# Patient Record
Sex: Female | Born: 1935 | Race: White | Hispanic: No | Marital: Single
Health system: Southern US, Community
[De-identification: ages and names within clinical notes are randomized; demographics above are authoritative.]

---

## 2013-02-01 ENCOUNTER — Inpatient Hospital Stay
Admission: AD | Admit: 2013-02-01 | Discharge: 2013-05-16 | Disposition: E | Payer: Medicare Other | Source: Ambulatory Visit | Attending: Internal Medicine | Admitting: Internal Medicine

## 2013-02-01 DIAGNOSIS — Z930 Tracheostomy status: Secondary | ICD-10-CM

## 2013-02-01 DIAGNOSIS — J9620 Acute and chronic respiratory failure, unspecified whether with hypoxia or hypercapnia: Secondary | ICD-10-CM

## 2013-02-01 DIAGNOSIS — J9601 Acute respiratory failure with hypoxia: Secondary | ICD-10-CM

## 2013-02-01 DIAGNOSIS — B49 Unspecified mycosis: Secondary | ICD-10-CM

## 2013-02-01 DIAGNOSIS — A0472 Enterocolitis due to Clostridium difficile, not specified as recurrent: Secondary | ICD-10-CM

## 2013-02-01 DIAGNOSIS — J189 Pneumonia, unspecified organism: Secondary | ICD-10-CM

## 2013-02-02 ENCOUNTER — Other Ambulatory Visit (HOSPITAL_COMMUNITY): Payer: Medicare Other

## 2013-02-02 LAB — CBC WITH DIFFERENTIAL/PLATELET
Basophils Absolute: 0.1 10*3/uL (ref 0.0–0.1)
Basophils Relative: 0 % (ref 0–1)
Eosinophils Relative: 1 % (ref 0–5)
HCT: 32.2 % — ABNORMAL LOW (ref 36.0–46.0)
Hemoglobin: 11 g/dL — ABNORMAL LOW (ref 12.0–15.0)
Lymphocytes Relative: 5 % — ABNORMAL LOW (ref 12–46)
Lymphs Abs: 1 10*3/uL (ref 0.7–4.0)
MCV: 94.4 fL (ref 78.0–100.0)
Monocytes Absolute: 1.1 10*3/uL — ABNORMAL HIGH (ref 0.1–1.0)
Monocytes Relative: 5 % (ref 3–12)
RDW: 15.4 % (ref 11.5–15.5)
WBC: 22.8 10*3/uL — ABNORMAL HIGH (ref 4.0–10.5)

## 2013-02-02 LAB — COMPREHENSIVE METABOLIC PANEL
Albumin: 3.5 g/dL (ref 3.5–5.2)
Alkaline Phosphatase: 32 U/L — ABNORMAL LOW (ref 39–117)
BUN: 23 mg/dL (ref 6–23)
CO2: 17 mEq/L — ABNORMAL LOW (ref 19–32)
Calcium: 8.5 mg/dL (ref 8.4–10.5)
Chloride: 109 mEq/L (ref 96–112)
Creatinine, Ser: 1.19 mg/dL — ABNORMAL HIGH (ref 0.50–1.10)
GFR calc Af Amer: 50 mL/min — ABNORMAL LOW (ref >90)
GFR calc non Af Amer: 43 mL/min — ABNORMAL LOW (ref >90)
Glucose, Bld: 104 mg/dL — ABNORMAL HIGH (ref 70–99)
Total Bilirubin: 0.6 mg/dL (ref 0.3–1.2)

## 2013-02-02 LAB — BLOOD GAS, ARTERIAL
Acid-base deficit: 5.5 mmol/L — ABNORMAL HIGH (ref 0.0–2.0)
Bicarbonate: 18.5 mEq/L — ABNORMAL LOW (ref 20.0–24.0)
FIO2: 0.35 %
O2 Saturation: 95 %
Patient temperature: 98.6

## 2013-02-02 LAB — PROCALCITONIN: Procalcitonin: 0.45 ng/mL

## 2013-02-02 LAB — TSH: TSH: 13.831 u[IU]/mL — ABNORMAL HIGH (ref 0.350–4.500)

## 2013-02-03 ENCOUNTER — Other Ambulatory Visit (HOSPITAL_COMMUNITY): Payer: Medicare Other

## 2013-02-03 DIAGNOSIS — J9601 Acute respiratory failure with hypoxia: Secondary | ICD-10-CM

## 2013-02-03 DIAGNOSIS — A0472 Enterocolitis due to Clostridium difficile, not specified as recurrent: Secondary | ICD-10-CM

## 2013-02-03 DIAGNOSIS — J189 Pneumonia, unspecified organism: Secondary | ICD-10-CM

## 2013-02-03 LAB — CBC
HCT: 24.4 % — ABNORMAL LOW (ref 36.0–46.0)
MCH: 31.7 pg (ref 26.0–34.0)
MCHC: 33.6 g/dL (ref 30.0–36.0)
MCV: 94.2 fL (ref 78.0–100.0)
Platelets: 291 10*3/uL (ref 150–400)
RBC: 2.59 MIL/uL — ABNORMAL LOW (ref 3.87–5.11)

## 2013-02-03 LAB — BASIC METABOLIC PANEL
CO2: 25 mEq/L (ref 19–32)
Calcium: 8.5 mg/dL (ref 8.4–10.5)
Glucose, Bld: 109 mg/dL — ABNORMAL HIGH (ref 70–99)
Sodium: 143 mEq/L (ref 135–145)

## 2013-02-03 LAB — BLOOD GAS, ARTERIAL
FIO2: 0.9 %
O2 Content: 35 L/min
pCO2 arterial: 45.7 mmHg — ABNORMAL HIGH (ref 35.0–45.0)
pH, Arterial: 7.344 — ABNORMAL LOW (ref 7.350–7.450)
pO2, Arterial: 146 mmHg — ABNORMAL HIGH (ref 80.0–100.0)

## 2013-02-03 NOTE — Consult Note (Signed)
PULMONARY  / CRITICAL CARE MEDICINE  Name: Michelle Cardenas MRN: [id number] DOB: November 10, 1934    ADMISSION DATE:  03/01/13 CONSULTATION DATE:  11/19  REFERRING MD :  11/19 PRIMARY SERVICE:  [facility]   CHIEF COMPLAINT:   Respiratory failure   BRIEF PATIENT DESCRIPTION:   78 year old female w/ h/o CP, dysphagia and MR. Resides at [facility]. Admitted to outside hospital for acute respiratory failure and septic shock  in setting of PCM, aspiration PNA, staph epidermis bacteremia (was likely contaminant). She was  treated w/ broad spec abx, fluid resuscitation, and mechanical ventilation. Extubated at outside hospital. Transferred to [facility] on 11/18 for continued abx and persistent high FIO2 needs. PCCM asked to see on 11/19 for persistent respiratory failure   SIGNIFICANT EVENTS / STUDIES:    LINES / TUBES: Left [place] CVL  CULTURES: Outside facility Blood cultures: staph epidermis Sputum: e coli and proteus C diff positive   ANTIBIOTICS: clinda 11/17>>> Oral vanc 11/17>>> zyvox and cefepime 8 days outside facility Oral vanc X 12 d prior to admit deficid 10 d prior to admit   PAST MEDICAL HISTORY :  Afib, cerebral palsy, MR, dysphagia DM, HTN , resides at SNF Prior to Admission medications   reviewed   Allergies not on file  FAMILY HISTORY:  No family history on file. SOCIAL HISTORY:  has no tobacco, alcohol, and drug history on file.  REVIEW OF SYSTEMS:   Unable   SUBJECTIVE:  Appears comfortable  VITAL SIGNS:   96% on 50% venturi  PHYSICAL EXAMINATION: General:  Chronically ill appearing white female, some mild WOB on venturi mask  Neuro:  Awake, interactive, non-verbal  HEENT:  No JVD Cardiovascular:  Reg irreg  Lungs:  Scattered rhonchi  Abdomen:  Distended, firm to palp + bowel sounds  Musculoskeletal:  Intact Skin:  Diffuse anasarca    Recent Labs Lab 02/02/13 0652 02/03/13 0605  NA 140 143  K 4.1 3.3*  CL 109 109  CO2 17* 25  BUN 23 21  CREATININE 1.19* 1.29*   GLUCOSE 104* 109*    Recent Labs Lab 02/02/13 0652 02/03/13 0605  HGB 11.0* 8.2*  HCT 32.2* 24.4*  WBC 22.8* 17.2*  PLT 346 291   ABG    Component Value Date/Time   PHART 7.393 02/02/2013 0838   PCO2ART 31.0* 02/02/2013 0838   PO2ART 75.7* 02/02/2013 0838   HCO3 18.5* 02/02/2013 0838   TCO2 19.4 02/02/2013 0838   ACIDBASEDEF 5.5* 02/02/2013 0838   O2SAT 95.0 02/02/2013 0838     Dg Chest Port 1 View  02/02/2013   CLINICAL DATA:  Respiratory failure  EXAM: PORTABLE CHEST - 1 VIEW  COMPARISON:  None.  FINDINGS: There is mild cardiomegaly with bilateral effusions and mild bibasilar edema. There is no appreciable airspace consolidation. Central catheter tip is in the right atrium. No pneumothorax. Pulmonary vascularity is within normal limits.  IMPRESSION: Evidence of a degree of congestive heart failure.  No pneumothorax.   Electronically Signed   By: Bretta Bang M.D.   On: 02/02/2013 08:04   Dg Abd Portable 1v  02/02/2013   CLINICAL DATA:  Follow-up ileus  EXAM: PORTABLE ABDOMEN - 1 VIEW  COMPARISON:  Portable exam 0617 hr without priors for comparison  FINDINGS: Gastrostomy tube left upper quadrant.  Nonobstructive bowel gas pattern.  No definite bowel dilatation.  Poorly defined area of calcification in pelvis may represent calcified uterine leiomyoma.  Slightly increased attenuation of the abdomen raising question of ascites.  Bones  appear demineralized with osteoarthritic changes of the left hip and prior right hip replacement.  No definite urinary tract calcification.  IMPRESSION: Nonobstructive bowel gas pattern.  Question ascites.   Electronically Signed   By: Ulyses Southward M.D.   On: 02/02/2013 08:04    ASSESSMENT / PLAN:  Acute respiratory failure. Multifactorial: aspiration pna, prob element of pulmonary edema and also R>L effusion. She appears massively volume overloaded.  rec Cont empiric abx per primary srvc although concerned about clinda in setting of active  Cdiff Would avoid empiric ABX  , clinda as able Cont diuresis although her creatinine may limit this Repeat CXR, might consider thoracentesis: diagnostic/ therapeutic of right effusion She is not a good BIPAP candidate in setting of CP, poor cough and air way management  Need to have goals of care discussion w/ family, probability of this occuring again is very high   SIRS/sepsis In setting of c diff,  aspiration pna, suspect Staph epidermis was contaminant  Plan: abx per primary service  After empiric ABX completed will need 10 days cdiff treatment past this  Acute renal failure  Anasarca  Had mild + AG acidosis prior on 11/18 Plan: Careful w/ diuresis, assess trend in am  F/u creatinine am    Dysphagia  Plan NPO G tube feeds  Anemia  Recent Labs Lab 02/02/13 0652 02/03/13 0605  HGB 11.0* 8.2*  has sig drift in hgb. She does not have clear evidence of bleeding. ? Lab error ? Component of dilution effect rec Have d/w primary service No role for x fusion currently   H/P CP and MR plan Supportive care   Pulmonary and Critical Care Medicine [facility] Pager: [phone]  02/03/2013, 10:22 AM  I have fully examined this patient and agree with above findings.    And edited infull  Mcarthur Rossetti. Tyson Alias, MD, FACP Pgr: [phone] [place] [facility]

## 2013-02-04 DIAGNOSIS — M7989 Other specified soft tissue disorders: Secondary | ICD-10-CM

## 2013-02-04 LAB — BASIC METABOLIC PANEL
CO2: 27 mEq/L (ref 19–32)
Chloride: 109 mEq/L (ref 96–112)
Potassium: 3.3 mEq/L — ABNORMAL LOW (ref 3.5–5.1)
Sodium: 144 mEq/L (ref 135–145)

## 2013-02-04 LAB — CBC
Platelets: 234 10*3/uL (ref 150–400)
RBC: 2.31 MIL/uL — ABNORMAL LOW (ref 3.87–5.11)
WBC: 27 10*3/uL — ABNORMAL HIGH (ref 4.0–10.5)

## 2013-02-04 NOTE — Progress Notes (Signed)
VASCULAR LAB PRELIMINARY  PRELIMINARY  PRELIMINARY  PRELIMINARY  Left upper extremity venous Doppler completed.    Preliminary report:  There is no obvious evidence of DVT or SVT noted in the left upper extremity.  Michelle Cardenas, RVT 02/04/2013, 3:00 PM

## 2013-02-05 ENCOUNTER — Other Ambulatory Visit (HOSPITAL_COMMUNITY): Payer: Medicare Other

## 2013-02-05 DIAGNOSIS — J9600 Acute respiratory failure, unspecified whether with hypoxia or hypercapnia: Secondary | ICD-10-CM

## 2013-02-05 DIAGNOSIS — A0472 Enterocolitis due to Clostridium difficile, not specified as recurrent: Secondary | ICD-10-CM

## 2013-02-05 DIAGNOSIS — J189 Pneumonia, unspecified organism: Secondary | ICD-10-CM

## 2013-02-05 LAB — BLOOD GAS, ARTERIAL
Acid-Base Excess: 3.8 mmol/L — ABNORMAL HIGH (ref 0.0–2.0)
Acid-Base Excess: 2.4 mmol/L — ABNORMAL HIGH (ref 0.0–2.0)
Acid-Base Excess: 3.2 mmol/L — ABNORMAL HIGH (ref 0.0–2.0)
Bicarbonate: 28.3 mEq/L — ABNORMAL HIGH (ref 20.0–24.0)
Bicarbonate: 27.9 mEq/L — ABNORMAL HIGH (ref 20.0–24.0)
Drawn by: 275531
FIO2: 0.7 %
FIO2: 0.8 %
MECHVT: 500 mL
O2 Saturation: 82.8 %
O2 Saturation: 98.3 %
PEEP: 5 cmH2O
Patient temperature: 98.6
Patient temperature: 98.6
TCO2: 29.6 mmol/L (ref 0–100)
pCO2 arterial: 55.4 mmHg — ABNORMAL HIGH (ref 35.0–45.0)
pH, Arterial: 7.395 (ref 7.350–7.450)
pH, Arterial: 7.405 (ref 7.350–7.450)
pO2, Arterial: 130 mmHg — ABNORMAL HIGH (ref 80.0–100.0)
pO2, Arterial: 101 mmHg — ABNORMAL HIGH (ref 80.0–100.0)

## 2013-02-05 LAB — CBC
HCT: 20.9 % — ABNORMAL LOW (ref 36.0–46.0)
Hemoglobin: 7 g/dL — ABNORMAL LOW (ref 12.0–15.0)
MCH: 31.7 pg (ref 26.0–34.0)
MCH: 31.8 pg (ref 26.0–34.0)
MCHC: 33.5 g/dL (ref 30.0–36.0)
MCV: 94.6 fL (ref 78.0–100.0)
MCV: 95 fL (ref 78.0–100.0)
Platelets: 183 10*3/uL (ref 150–400)
Platelets: 190 10*3/uL (ref 150–400)
RBC: 2.21 MIL/uL — ABNORMAL LOW (ref 3.87–5.11)
WBC: 20.2 10*3/uL — ABNORMAL HIGH (ref 4.0–10.5)

## 2013-02-05 LAB — BASIC METABOLIC PANEL
BUN: 20 mg/dL (ref 6–23)
CO2: 24 mEq/L (ref 19–32)
CO2: 28 mEq/L (ref 19–32)
Calcium: 8.3 mg/dL — ABNORMAL LOW (ref 8.4–10.5)
Calcium: 8.5 mg/dL (ref 8.4–10.5)
Chloride: 111 mEq/L (ref 96–112)
Chloride: 108 mEq/L (ref 96–112)
Creatinine, Ser: 1.3 mg/dL — ABNORMAL HIGH (ref 0.50–1.10)
Creatinine, Ser: 1.38 mg/dL — ABNORMAL HIGH (ref 0.50–1.10)
GFR calc Af Amer: 45 mL/min — ABNORMAL LOW (ref >90)
GFR calc non Af Amer: 36 mL/min — ABNORMAL LOW (ref >90)
Glucose, Bld: 154 mg/dL — ABNORMAL HIGH (ref 70–99)
Sodium: 146 mEq/L — ABNORMAL HIGH (ref 135–145)

## 2013-02-05 LAB — HEPATIC FUNCTION PANEL
AST: 22 U/L (ref 0–37)
Albumin: 3.4 g/dL — ABNORMAL LOW (ref 3.5–5.2)
Total Bilirubin: 0.4 mg/dL (ref 0.3–1.2)
Total Protein: 5.2 g/dL — ABNORMAL LOW (ref 6.0–8.3)

## 2013-02-05 LAB — PREPARE RBC (CROSSMATCH)

## 2013-02-05 NOTE — Progress Notes (Signed)
PULMONARY  / CRITICAL CARE MEDICINE  Name: Michelle Cardenas MRN: [id number] DOB: 1934/11/05    ADMISSION DATE:  18-Feb-2013 CONSULTATION DATE:  11/19  REFERRING MD :  11/19 PRIMARY SERVICE:  [facility]   CHIEF COMPLAINT:   Respiratory failure   BRIEF PATIENT DESCRIPTION:   78 year old female w/ h/o CP, dysphagia and MR. Resides at [facility]. Admitted to outside hospital for acute respiratory failure and septic shock  in setting of PCM, aspiration PNA, staph epidermis bacteremia (was likely contaminant). She was  treated w/ broad spec abx, fluid resuscitation, and mechanical ventilation. Extubated at outside hospital. Transferred to [facility] on 11/18 for continued abx and persistent high FIO2 needs. PCCM asked to see on 11/19 for persistent respiratory failure   SIGNIFICANT EVENTS / STUDIES:    LINES / TUBES: Left [place] CVL oett 11/21>>>  CULTURES: Outside facility Blood cultures: staph epidermis Sputum: e coli and proteus C diff positive  Sputum 11/21>>>  ANTIBIOTICS: clinda 11/17>>>off Oral vanc 11/17>>> zyvox and cefepime 8 days outside facility Oral vanc X 12 d prior to admit deficid 10 d prior to admit   SUBJECTIVE:  Worsening respiratory failure  VITAL SIGNS:   99% on 100% PHYSICAL EXAMINATION: General:  Chronically ill appearing white female, some mild WOB on venturi mask  Neuro:  Awake, interactive, non-verbal  HEENT:  No JVD Cardiovascular:  Reg irreg  Lungs:  Scattered coarse rhonchi/ decreased on left  Abdomen:  Distended, firm to palp + bowel sounds  Musculoskeletal:  Intact Skin:  Diffuse anasarca    Recent Labs Lab 02/03/13 0605 02/04/13 0520 02/05/13 0610  NA 143 144 146*  K 3.3* 3.3* 3.4*  CL 109 109 111  CO2 25 27 24   BUN 21 20 20   CREATININE 1.29* 1.26* 1.30*  GLUCOSE 109* 115* 122*    Recent Labs Lab 02/03/13 0605 02/04/13 0520 02/05/13 0610  HGB 8.2* 7.5* 7.0*  HCT 24.4* 22.0* 20.9*  WBC 17.2* 27.0* 20.2*  PLT 291 234 183   ABG    Component  Value Date/Time   PHART 7.395 02/05/2013 0900   PCO2ART 47.3* 02/05/2013 0900   PO2ART 49.1* 02/05/2013 0900   HCO3 28.3* 02/05/2013 0900   TCO2 29.8 02/05/2013 0900   ACIDBASEDEF 0.7 02/03/2013 1620   O2SAT 82.8 02/05/2013 0900     Dg Chest Port 1 View  02/05/2013   CLINICAL DATA:  78 year old female with respiratory failure, decreased mental status. Initial encounter.  EXAM: PORTABLE CHEST - 1 VIEW  COMPARISON:  01/1913 and earlier.  FINDINGS: Portable AP semi upright view at 0655 hrs. Stable left subclavian central line. Veiling pleural effusions, larger on the left. Dense retrocardiac opacity. Confluent perihilar opacity. No pneumothorax. Stable cardiac size and mediastinal contours.  IMPRESSION: Left greater than right pleural effusions with lower lobe collapse / consolidation. Indistinct perihilar opacity suspicious for superimposed pulmonary edema.  Overall ventilation not significantly changed.   Electronically Signed   By: Augusto Gamble M.D.   On: 02/05/2013 07:55   Dg Chest Port 1 View  02/03/2013   CLINICAL DATA:  Respiratory failure, cough  EXAM: PORTABLE CHEST - 1 VIEW  COMPARISON:  02/02/2013  FINDINGS: There is a left-sided subclavian central venous catheter projecting over the SVC in unchanged position. The cardiomediastinal silhouette is enlarged. There are trace bilateral pleural effusions. There is bilateral interstitial thickening and prominence of the central pulmonary vasculature. The osseous structures are unremarkable.  IMPRESSION: No significant interval change compared with the prior exam. Mild persistent congestive  failure.   Electronically Signed   By: Elige Ko   On: 02/03/2013 12:04    ASSESSMENT / PLAN:  Acute respiratory failure. Multifactorial: persistent aspiration pna, prob element of pulmonary edema, bilateral effusions,  Recurrent mucous plugging and ATX Volume overload rec Full vent support Cont diuresis although her creatinine may limit this Repeat  CXR, might consider thoracentesis: diagnostic/ therapeutic on larger fluid collection  Repeat PCT and sputum  emipric vanc/merrem per primary  Think now that she is intubated we need to consider trach   SIRS/sepsis In setting of c diff,  aspiration pna, suspect Staph epidermis was contaminant  Plan: abx per primary service  After empiric ABX completed will need 10 days cdiff treatment past this  Acute renal failure  Anasarca  Mild hypernatremia Plan: Careful w/ diuresis, assess trend in am  F/u creatinine am  Free water per primary team   Dysphagia  Plan NPO G tube feeds  Anemia  Recent Labs Lab 02/03/13 0605 02/04/13 0520 02/05/13 0610  HGB 8.2* 7.5* 7.0*  has sig drift in hgb. She does not have clear evidence of bleeding. ? Lab error ? Component of dilution effect rec Have d/w primary service No role for x fusion currently   H/P CP and MR plan Supportive care  Discussed at length during multi-disciplinary rounds.   02/05/2013, 10:22 AM  Attending:  I have seen and examined the patient with nurse practitioner/resident and agree with the note above.   CXR reviewed, pull tube back Fentanyl now Repeat ABG 2 hours Agree with plans for antibiotics Tracheostomy should be considered given family's goals of care  CC time 45 minutes.  Yolonda Kida PCCM Pager: [phone] Cell: [phone] If no response, call [phone]

## 2013-02-05 NOTE — Procedures (Signed)
Intubation Procedure Note Daryn Pisani [id number] [birth year]  Procedure: Intubation Indications: Respiratory insufficiency  Procedure Details Consent: Unable to obtain consent because of emergent medical necessity. Time Out: Verified patient identification, verified procedure, site/side was marked, verified correct patient position, special equipment/implants available, medications/allergies/relevent history reviewed, required imaging and test results available.  Performed  Drugs Etomidate 10mg , Fentanyl , Versed 1mg  DL x 1 with GS3 blade Grade 1 view 7.5 tube passed through cords under direct visualization Placement confirmed with bilateral breath sounds, positive EtCO2 change and smoke in tube   Evaluation Hemodynamic Status: BP stable throughout; O2 sats: stable throughout Patient's Current Condition: stable Complications: No apparent complications Patient did tolerate procedure well. Chest X-ray ordered to verify placement.  CXR: tube position low-repostitioned.  I supervised RT and Anders Simmonds ACNP during the procedure  Max Fickle 02/05/2013

## 2013-02-06 ENCOUNTER — Other Ambulatory Visit (HOSPITAL_COMMUNITY): Payer: Medicare Other

## 2013-02-06 LAB — BLOOD GAS, ARTERIAL
Acid-Base Excess: 3.5 mmol/L — ABNORMAL HIGH (ref 0.0–2.0)
FIO2: 0.5 %
MECHVT: 500 mL
O2 Saturation: 99.4 %
PEEP: 5 cmH2O
Patient temperature: 97
RATE: 12 resp/min
pO2, Arterial: 134 mmHg — ABNORMAL HIGH (ref 80.0–100.0)

## 2013-02-06 LAB — CBC
HCT: 25.9 % — ABNORMAL LOW (ref 36.0–46.0)
Hemoglobin: 8.9 g/dL — ABNORMAL LOW (ref 12.0–15.0)
MCHC: 34.4 g/dL (ref 30.0–36.0)
MCV: 89.3 fL (ref 78.0–100.0)
Platelets: 145 10*3/uL — ABNORMAL LOW (ref 150–400)
RDW: 16.5 % — ABNORMAL HIGH (ref 11.5–15.5)

## 2013-02-06 LAB — BASIC METABOLIC PANEL
BUN: 24 mg/dL — ABNORMAL HIGH (ref 6–23)
Calcium: 8.2 mg/dL — ABNORMAL LOW (ref 8.4–10.5)
Chloride: 108 mEq/L (ref 96–112)
Creatinine, Ser: 1.34 mg/dL — ABNORMAL HIGH (ref 0.50–1.10)
GFR calc Af Amer: 43 mL/min — ABNORMAL LOW (ref >90)
GFR calc non Af Amer: 37 mL/min — ABNORMAL LOW (ref >90)
Glucose, Bld: 115 mg/dL — ABNORMAL HIGH (ref 70–99)

## 2013-02-06 LAB — HEMOGLOBIN AND HEMATOCRIT, BLOOD
HCT: 28.6 % — ABNORMAL LOW (ref 36.0–46.0)
Hemoglobin: 10.6 g/dL — ABNORMAL LOW (ref 12.0–15.0)

## 2013-02-06 NOTE — Progress Notes (Signed)
PULMONARY  / CRITICAL CARE MEDICINE  Name: Michelle Cardenas MRN: [id number] DOB: 11/18/1934    ADMISSION DATE:  February 18, 2013 CONSULTATION DATE:  11/19  REFERRING MD :  11/19 PRIMARY SERVICE:  [facility]   CHIEF COMPLAINT:   Respiratory failure   BRIEF PATIENT DESCRIPTION:   78 year old female w/ h/o CP, dysphagia and MR. Resides at [facility]. Admitted to outside hospital for acute respiratory failure and septic shock  in setting of PCM, aspiration PNA, staph epidermis bacteremia (was likely contaminant). She was  treated w/ broad spec abx, fluid resuscitation, and mechanical ventilation. Extubated at outside hospital. Transferred to [facility] on 11/18 for continued abx and persistent high FIO2 needs. PCCM asked to see on 11/19 for persistent respiratory failure   SIGNIFICANT EVENTS / STUDIES:    LINES / TUBES: Left [place] CVL>> oett 11/21>>>  CULTURES: Outside facility Blood cultures: staph epidermis Sputum: e coli and proteus C diff positive  Sputum 11/21>>>  ANTIBIOTICS: clinda 11/17>>>off Oral vanc 11/17>>> zyvox and cefepime 8 days outside facility Oral vanc X 12 d prior to admit deficid 10 d prior to admit   SUBJECTIVE:  On full vent support VITAL SIGNS:   Vital signs reviewed. Abnormal values will appear under impression plan section.   PHYSICAL EXAMINATION: General:  Chronically ill appearing white female, coughing and gagging on vent Neuro:  MAE x 4, agitated HEENT:  No JVD Cardiovascular:  Reg irreg  Lungs:  Scattered coarse rhonchi/ decreased bilat bases Abdomen:  Distended, firm to palp + bowel sounds  Musculoskeletal:  Intact Skin:  Diffuse anasarca    Recent Labs Lab 02/04/13 0520 02/05/13 0610 02/05/13 1700  NA 144 146* 146*  K 3.3* 3.4* 3.4*  CL 109 111 108  CO2 27 24 28   BUN 20 20 20   CREATININE 1.26* 1.30* 1.38*  GLUCOSE 115* 122* 154*    Recent Labs Lab 02/04/13 0520 02/05/13 0610 02/05/13 1700  HGB 7.5* 7.0* 7.0*  HCT 22.0* 20.9* 20.9*  WBC 27.0*  20.2* 21.1*  PLT 234 183 190   ABG    Component Value Date/Time   PHART 7.467* 02/06/2013 0622   PCO2ART 37.7 02/06/2013 0622   PO2ART 134.0* 02/06/2013 0622   HCO3 27.2* 02/06/2013 0622   TCO2 28.4 02/06/2013 0622   ACIDBASEDEF 0.7 02/03/2013 1620   O2SAT 99.4 02/06/2013 0622     Dg Chest Port 1 View  02/06/2013   CLINICAL DATA:  Respiratory failure.  EXAM: PORTABLE CHEST - 1 VIEW  COMPARISON:  February 05, 2013.  FINDINGS: Stable cardiomegaly. Endotracheal tube and left subclavian catheter line are unchanged in position. No pneumothorax is noted. Stable bilateral basilar opacities are noted. Patient is rotated to the left. Dextroscoliosis of thoracic spine is noted. Bony thorax is intact.  IMPRESSION: Stable cardiomegaly and bilateral basilar opacities. Support apparatus unchanged.   Electronically Signed   By: Roque Lias M.D.   On: 02/06/2013 07:37   Dg Chest Port 1 View  02/05/2013   CLINICAL DATA:  Endotracheal tube placement  EXAM: PORTABLE CHEST - 1 VIEW  COMPARISON:  02/05/2013  FINDINGS: Endotracheal tube tip is at the level the carina. Recommend retracting by 3 cm.  Left central line tip cavoatrial junction/proximal right atrium.  No gross pneumothorax.  Cardiomegaly.  Rotation to the left.  Calcified aorta which may be tortuous.  Pulmonary edema with bilateral pleural effusions.  Consolidation left base may represent pleural fluid and atelectasis. Limited for evaluating for underlying mass or infiltrate.  IMPRESSION: Endotracheal tube  tip is at the level the carina. Recommend retracting by 3 cm.  Left central line tip cavoatrial junction/proximal right atrium.  No gross pneumothorax.  Cardiomegaly.  Rotation to the left.  Calcified aorta which may be tortuous.  Pulmonary edema with bilateral pleural effusions.  Consolidation left base may represent pleural fluid and atelectasis. Limited for evaluating for underlying mass or infiltrate.  These results were called by telephone at the  time of interpretation on 02/05/2013 at 11:28 AM to Sherran Needs patient's nurse, who verbally acknowledged these results.   Electronically Signed   By: Bridgett Larsson M.D.   On: 02/05/2013 11:30   Dg Chest Port 1 View  02/05/2013   CLINICAL DATA:  78 year old female with respiratory failure, decreased mental status. Initial encounter.  EXAM: PORTABLE CHEST - 1 VIEW  COMPARISON:  01/1913 and earlier.  FINDINGS: Portable AP semi upright view at 0655 hrs. Stable left subclavian central line. Veiling pleural effusions, larger on the left. Dense retrocardiac opacity. Confluent perihilar opacity. No pneumothorax. Stable cardiac size and mediastinal contours.  IMPRESSION: Left greater than right pleural effusions with lower lobe collapse / consolidation. Indistinct perihilar opacity suspicious for superimposed pulmonary edema.  Overall ventilation not significantly changed.   Electronically Signed   By: Augusto Gamble M.D.   On: 02/05/2013 07:55    ASSESSMENT / PLAN:  Acute respiratory failure. Multifactorial: persistent aspiration pna, prob element of pulmonary edema, bilateral effusions,  Recurrent mucous plugging and ATX Volume overload rec Full vent support Cont diuresis although her creatinine may limit this Repeat CXR, might consider thoracentesis: diagnostic/ therapeutic on larger fluid collection  Follow PCT and sputum cx emipric vanc/merrem per primary  Think now that she is intubated we need to consider trach   SIRS/sepsis In setting of c diff,  aspiration pna, suspect Staph epidermis was contaminant  Plan: abx per primary service  After empiric ABX completed will need 10 days cdiff treatment past this  Acute renal failure  Lab Results  Component Value Date   CREATININE 1.38* 02/05/2013   CREATININE 1.30* 02/05/2013   CREATININE 1.26* 02/04/2013    Recent Labs Lab 02/04/13 0520 02/05/13 0610 02/05/13 1700  NA 144 146* 146*    Anasarca  Mild hypernatremia Plan: Careful w/  diuresis, assess trend in am  F/u creatinine am  Free water per primary team   Dysphagia  Plan NPO G tube feeds  Anemia  Recent Labs Lab 02/04/13 0520 02/05/13 0610 02/05/13 1700  HGB 7.5* 7.0* 7.0*  has sig drift in hgb. She does not have clear evidence of bleeding. ? Lab error ? Component of dilution effect rec No role for x fusion currently  Follow h/h  H/P CP and MR plan Supportive care    [facility] ACNP Adolph Pollack PCCM Pager [phone] till 3 pm If no answer page [phone] 02/06/2013, 10:17 AM  Levy Pupa, MD, PhD 02/06/2013, 3:09 PM [place] [facility] [phone] or if no answer [phone]

## 2013-02-07 ENCOUNTER — Other Ambulatory Visit (HOSPITAL_COMMUNITY): Payer: Medicare Other

## 2013-02-07 LAB — CBC
HCT: 28.9 % — ABNORMAL LOW (ref 36.0–46.0)
Hemoglobin: 9.8 g/dL — ABNORMAL LOW (ref 12.0–15.0)
MCH: 30.4 pg (ref 26.0–34.0)
MCV: 89.8 fL (ref 78.0–100.0)
Platelets: 156 10*3/uL (ref 150–400)
RBC: 3.22 MIL/uL — ABNORMAL LOW (ref 3.87–5.11)

## 2013-02-07 LAB — BASIC METABOLIC PANEL
BUN: 25 mg/dL — ABNORMAL HIGH (ref 6–23)
CO2: 28 mEq/L (ref 19–32)
Calcium: 7.9 mg/dL — ABNORMAL LOW (ref 8.4–10.5)
Creatinine, Ser: 1.1 mg/dL (ref 0.50–1.10)
Glucose, Bld: 133 mg/dL — ABNORMAL HIGH (ref 70–99)
Sodium: 144 mEq/L (ref 135–145)

## 2013-02-07 LAB — HEMOGLOBIN AND HEMATOCRIT, BLOOD: HCT: 32.4 % — ABNORMAL LOW (ref 36.0–46.0)

## 2013-02-08 ENCOUNTER — Other Ambulatory Visit (HOSPITAL_COMMUNITY): Payer: Medicare Other

## 2013-02-08 ENCOUNTER — Encounter: Payer: Self-pay | Admitting: Radiology

## 2013-02-08 LAB — CBC
HCT: 28 % — ABNORMAL LOW (ref 36.0–46.0)
Hemoglobin: 9.5 g/dL — ABNORMAL LOW (ref 12.0–15.0)
MCH: 30.7 pg (ref 26.0–34.0)
RBC: 3.09 MIL/uL — ABNORMAL LOW (ref 3.87–5.11)
WBC: 14.2 10*3/uL — ABNORMAL HIGH (ref 4.0–10.5)

## 2013-02-08 LAB — BASIC METABOLIC PANEL
CO2: 25 mEq/L (ref 19–32)
Calcium: 8 mg/dL — ABNORMAL LOW (ref 8.4–10.5)
Chloride: 106 mEq/L (ref 96–112)
Glucose, Bld: 135 mg/dL — ABNORMAL HIGH (ref 70–99)
Potassium: 4.1 mEq/L (ref 3.5–5.1)
Sodium: 141 mEq/L (ref 135–145)

## 2013-02-08 LAB — BLOOD GAS, ARTERIAL
Acid-Base Excess: 3.1 mmol/L — ABNORMAL HIGH (ref 0.0–2.0)
Bicarbonate: 26.4 mEq/L — ABNORMAL HIGH (ref 20.0–24.0)
FIO2: 0.4 %
O2 Saturation: 97.9 %
PEEP: 5 cmH2O

## 2013-02-08 LAB — VANCOMYCIN, TROUGH: Vancomycin Tr: 20.4 ug/mL — ABNORMAL HIGH (ref 10.0–20.0)

## 2013-02-08 MED ORDER — IOHEXOL 300 MG/ML  SOLN
100.0000 mL | Freq: Once | INTRAMUSCULAR | Status: AC | PRN
  Administered 2013-02-08: 100 mL via INTRAVENOUS

## 2013-02-08 NOTE — Progress Notes (Signed)
PULMONARY  / CRITICAL CARE MEDICINE  Name: Michelle Cardenas MRN: [id number] DOB: March 08, 1935    ADMISSION DATE:  02/06/13 CONSULTATION DATE:  11/19  REFERRING MD :  11/19 PRIMARY SERVICE:  [facility]   CHIEF COMPLAINT:   Respiratory failure   BRIEF PATIENT DESCRIPTION:   78 year old female w/ h/o CP, dysphagia and MR. Resides at [facility]. Admitted to outside hospital for acute respiratory failure and septic shock  in setting of PCM, aspiration PNA, staph epidermis bacteremia (was likely contaminant). She was  treated w/ broad spec abx, fluid resuscitation, and mechanical ventilation. Extubated at outside hospital. Transferred to [facility] on 11/18 for continued abx and persistent high FIO2 needs. PCCM asked to see on 11/19 for persistent respiratory failure   SIGNIFICANT EVENTS / STUDIES:    LINES / TUBES: Left [place] CVL>> oett 11/21>>>  CULTURES: Outside facility Blood cultures: staph epidermis Sputum: e coli and proteus C diff positive  Sputum 11/21>>>  ANTIBIOTICS: clinda 11/17>>>off Oral vanc 11/17>>> zyvox and cefepime 8 days outside facility Oral vanc X 12 d prior to admit deficid 10 d prior to admit   SUBJECTIVE:  On full vent support Weans on PS 10/5 Oral secretions ++   VITAL SIGNS:   Vital signs reviewed. Abnormal values will appear under impression plan section.   PHYSICAL EXAMINATION: General:  Chronically ill appearing white female, coughing and gagging on vent Neuro:  MAE x 4, agitated HEENT:  No JVD Cardiovascular:  Reg irreg  Lungs:  Scattered coarse rhonchi/ decreased bilat bases Abdomen:  Distended, firm to palp + bowel sounds  Musculoskeletal:  Intact Skin:  Diffuse anasarca    Recent Labs Lab 02/06/13 1220 02/07/13 0633 02/08/13 0545  NA 146* 144 141  K 3.1* 3.0* 4.1  CL 108 107 106  CO2 29 28 25   BUN 24* 25* 27*  CREATININE 1.34* 1.10 1.08  GLUCOSE 115* 133* 135*    Recent Labs Lab 02/06/13 1220  02/07/13 0633 02/07/13 1440 02/08/13 0545   HGB 8.9*  < > 9.8* 11.2* 9.5*  HCT 25.9*  < > 28.9* 32.4* 28.0*  WBC 15.8*  --  17.2*  --  14.2*  PLT 145*  --  156  --  209  < > = values in this interval not displayed. ABG    Component Value Date/Time   PHART 7.467* 02/06/2013 0622   PCO2ART 37.7 02/06/2013 0622   PO2ART 134.0* 02/06/2013 0622   HCO3 27.2* 02/06/2013 0622   TCO2 28.4 02/06/2013 0622   ACIDBASEDEF 0.7 02/03/2013 1620   O2SAT 99.4 02/06/2013 0622     Dg Chest Port 1 View  02/08/2013   CLINICAL DATA:  Respiratory failure  EXAM: PORTABLE CHEST - 1 VIEW  COMPARISON:  02/07/2013; 02/06/2013; 02/02/2013  FINDINGS: Grossly unchanged enlarged cardiac silhouette and mediastinal contours with atherosclerotic plaque within the thoracic aorta. Interval of aspirin of endotracheal tube with tip overlying the tracheal air column, now approximately 1.9 cm above the carina. Otherwise, stable position of support apparatus. Overall improved aeration of the lungs with persistent right infrahilar heterogeneous opacity and left basilar consolidative opacities. The pulmonary vasculature is more distinct on the present examination. There is a minimal amount of pleural parenchymal thickening along the right minor fissure. Persistent partial atelectasis of the right upper lung. Trace left-sided effusion is not excluded. No pneumothorax. Unchanged bones.  IMPRESSION: 1. Appropriately positioned support apparatus as above. No pneumothorax. 2. Overall improved aeration of the lungs suggests improved atelectasis and pulmonary edema. Residual opacities  may represent atelectasis though underlying infection not excluded.   Electronically Signed   By: Simonne Come M.D.   On: 02/08/2013 07:37   Dg Chest Port 1 View  02/07/2013   CLINICAL DATA:  Endotracheal tube placement.  EXAM: PORTABLE CHEST - 1 VIEW  COMPARISON:  February 06, 2013.  FINDINGS: Stable cardiomegaly. Endotracheal tube is seen with distal tip approximately 4.5 cm above the carinal. Left  subclavian catheter line is unchanged in position. Stable right lower lobe opacity is noted concerning for pneumonia or subsegmental atelectasis. No pneumothorax is noted. Stable left basilar opacity.  IMPRESSION: Stable bilateral basilar opacities. No pneumothorax is noted. Endotracheal tube is in grossly good position.   Electronically Signed   By: Roque Lias M.D.   On: 02/07/2013 07:40  RLL airspace disease.   ASSESSMENT / PLAN:  Acute respiratory failure. Multifactorial: persistent aspiration pna, prob element of pulmonary edema, bilateral effusions,  Recurrent mucous plugging and ATX Volume overload rec Ct SBTs Cont diuresis although her creatinine may limit this emipric vanc/merrem per primary  May have to consider trach -get better sense of baseline from POA  SIRS/sepsis In setting of c diff,  aspiration pna, suspect Staph epidermis was contaminant  Plan: abx per primary service  After empiric ABX completed will need 10 days cdiff treatment past this  Acute renal failure  Lab Results  Component Value Date   CREATININE 1.08 02/08/2013   CREATININE 1.10 02/07/2013   CREATININE 1.34* 02/06/2013    Recent Labs Lab 02/06/13 1220 02/07/13 0633 02/08/13 0545  NA 146* 144 141   Anasarca  Mild hypernatremia Plan: Careful w/ diuresis, assess trend in am  F/u creatinine am  Free water per primary team   Dysphagia  Plan NPO G tube feeds  Anemia  Recent Labs Lab 02/07/13 0633 02/07/13 1440 02/08/13 0545  HGB 9.8* 11.2* 9.5*  has sig drift in hgb. She does not have clear evidence of bleeding. ? Lab error ? Component of dilution effect rec No role for x fusion currently  Follow h/h  H/P CP and MR plan Supportive care  Discussed on multidisciplinary rounds with select team   Cyril Mourning MD. FCCP. [place] [facility] Pager [phone] If no response call [phone]

## 2013-02-09 ENCOUNTER — Other Ambulatory Visit (HOSPITAL_COMMUNITY): Payer: Medicare Other

## 2013-02-09 LAB — BASIC METABOLIC PANEL
BUN: 26 mg/dL — ABNORMAL HIGH (ref 6–23)
Chloride: 101 mEq/L (ref 96–112)
Creatinine, Ser: 1.04 mg/dL (ref 0.50–1.10)
GFR calc Af Amer: 59 mL/min — ABNORMAL LOW (ref >90)
GFR calc non Af Amer: 51 mL/min — ABNORMAL LOW (ref >90)
Potassium: 2.9 mEq/L — ABNORMAL LOW (ref 3.5–5.1)

## 2013-02-09 LAB — TYPE AND SCREEN
ABO/RH(D): A POS
Unit division: 0
Unit division: 0
Unit division: 0
Unit division: 0

## 2013-02-09 LAB — CBC
HCT: 27.1 % — ABNORMAL LOW (ref 36.0–46.0)
MCHC: 34.7 g/dL (ref 30.0–36.0)
MCV: 90.6 fL (ref 78.0–100.0)
Platelets: 241 10*3/uL (ref 150–400)
RBC: 2.99 MIL/uL — ABNORMAL LOW (ref 3.87–5.11)
RDW: 16.6 % — ABNORMAL HIGH (ref 11.5–15.5)
WBC: 12 10*3/uL — ABNORMAL HIGH (ref 4.0–10.5)

## 2013-02-09 LAB — APTT: aPTT: 30 seconds (ref 24–37)

## 2013-02-09 LAB — PROTIME-INR: Prothrombin Time: 14 seconds (ref 11.6–15.2)

## 2013-02-09 NOTE — Progress Notes (Signed)
PULMONARY  / CRITICAL CARE MEDICINE  Name: Michelle Cardenas MRN: [id number] DOB: 1935/01/07    ADMISSION DATE:  01/17/2013 CONSULTATION DATE:  11/19  REFERRING MD :  11/19 PRIMARY SERVICE:  [facility]   CHIEF COMPLAINT:   Respiratory failure   BRIEF PATIENT DESCRIPTION:   78 year old female w/ h/o CP, dysphagia and MR. Resides at [facility]. Admitted to outside hospital for acute respiratory failure and septic shock  in setting of PCM, aspiration PNA, staph epidermis bacteremia (was likely contaminant). She was  treated w/ broad spec abx, fluid resuscitation, and mechanical ventilation. Extubated at outside hospital. Transferred to [facility] on 11/18 for continued abx and persistent high FIO2 needs. PCCM asked to see on 11/19 for persistent respiratory failure   SIGNIFICANT EVENTS / STUDIES:    LINES / TUBES: Left [place] CVL>> oett 11/21>>>  CULTURES: Outside facility Blood cultures: staph epidermis Sputum: e coli and proteus C diff positive   ANTIBIOTICS: clinda 11/17>>>off Oral vanc 11/17>>> zyvox and cefepime 8 days outside facility Oral vanc X 12 d prior to admit deficid 10 d prior to admit   SUBJECTIVE:   Weans on PS 5/5 Oral secretions ++ Afebrile   VITAL SIGNS:   Vital signs reviewed. Abnormal values will appear under impression plan section.   PHYSICAL EXAMINATION: General:  Chronically ill appearing white female, coughing and gagging on vent Neuro:  MAE x 4, int agitated HEENT:  No JVD Cardiovascular:  Reg irreg  Lungs:  Scattered coarse rhonchi/ decreased bilat bases Abdomen:  Distended, firm to palp + bowel sounds  Musculoskeletal:  Intact Skin:  Diffuse anasarca    Recent Labs Lab 02/07/13 0633 02/08/13 0545 02/09/13 0725  NA 144 141 140  K 3.0* 4.1 2.9*  CL 107 106 101  CO2 28 25 27   BUN 25* 27* 26*  CREATININE 1.10 1.08 1.04  GLUCOSE 133* 135* 123*    Recent Labs Lab 02/07/13 0633 02/07/13 1440 02/08/13 0545 02/09/13 0725  HGB 9.8* 11.2* 9.5* 9.4*   HCT 28.9* 32.4* 28.0* 27.1*  WBC 17.2*  --  14.2* 12.0*  PLT 156  --  209 241   ABG    Component Value Date/Time   PHART 7.486* 02/08/2013 1245   PCO2ART 35.4 02/08/2013 1245   PO2ART 96.1 02/08/2013 1245   HCO3 26.4* 02/08/2013 1245   TCO2 27.5 02/08/2013 1245   ACIDBASEDEF 0.7 02/03/2013 1620   O2SAT 97.9 02/08/2013 1245     Ct Abdomen Pelvis W Contrast  02/09/2013   CLINICAL DATA:  Diffuse abdominal pain. GI bleeding. Pseudomembranous colitis.  EXAM: CT ABDOMEN AND PELVIS WITH CONTRAST  TECHNIQUE: Multidetector CT imaging of the abdomen and pelvis was performed using the standard protocol following bolus administration of intravenous contrast.  CONTRAST:  OMNIPAQUE IOHEXOL 300 MG/ML  SOLN  COMPARISON:  None.  FINDINGS: Images through the lung bases show small bilateral pleural effusions and bibasilar atelectasis. Cardiomegaly also noted.  A moderate size hiatal hernia is seen. The liver, spleen, pancreas, adrenal glands, and right kidney are normal in appearance. Left lower pole renal scarring noted, however there is no evidence of renal masses or hydronephrosis.  Gallbladder contains high attenuation sludge or contrast, however there is no evidence of acute cholecystitis. A percutaneous gastrostomy tube is seen in place. No evidence of bowel obstruction.  Diffuse colonic wall thickening is seen, consistent with diffuse colitis. Mild ascites and diffuse body wall edema also noted. In addition, there are rim enhancing fluid collections in the right iliacus  muscle, largest measuring 3.6 cm on image 72. These are consistent with small abscesses. Beam hardening artifact through the inferior pelvis seen due to right hip prosthesis. Foley catheter seen within the urinary bladder which is decompressed.  IMPRESSION: Diffuse colitis, consistent with history of C difficile colitis.  Small rim enhancing fluid collections in right iliacus muscle measuring up to 3.6 cm, consistent with small  abscesses.  Mild ascites and diffuse body wall edema. Small bilateral pleural effusions and bibasilar atelectasis also noted.  Moderate size hiatal hernia.   Electronically Signed   By: Myles Rosenthal M.D.   On: 02/09/2013 00:50   Dg Chest Port 1 View  02/09/2013   CLINICAL DATA:  Respiratory failure, fluid overload  EXAM: PORTABLE CHEST - 1 VIEW  COMPARISON:  Portable chest x-ray of 02/08/2013  FINDINGS: The tip of the endotracheal tube is more difficult to visualize due to overlap of NG tube and central venous line as well as rotation. A straight film is recommended to assess position. There is haziness at both lung bases consistent with atelectasis and effusions and there may be mild pulmonary vascular congestion present. Cardiomegaly is stable.  IMPRESSION: 1. Due to rotation, the tip of the endotracheal tube cannot be evaluated. Recommend straighter film. 2. Pulmonary vascular congestion and basilar atelectasis with possible effusions.   Electronically Signed   By: Dwyane Dee M.D.   On: 02/09/2013 08:09   Dg Chest Port 1 View  02/08/2013   CLINICAL DATA:  Respiratory failure  EXAM: PORTABLE CHEST - 1 VIEW  COMPARISON:  02/07/2013; 02/06/2013; 02/02/2013  FINDINGS: Grossly unchanged enlarged cardiac silhouette and mediastinal contours with atherosclerotic plaque within the thoracic aorta. Interval of aspirin of endotracheal tube with tip overlying the tracheal air column, now approximately 1.9 cm above the carina. Otherwise, stable position of support apparatus. Overall improved aeration of the lungs with persistent right infrahilar heterogeneous opacity and left basilar consolidative opacities. The pulmonary vasculature is more distinct on the present examination. There is a minimal amount of pleural parenchymal thickening along the right minor fissure. Persistent partial atelectasis of the right upper lung. Trace left-sided effusion is not excluded. No pneumothorax. Unchanged bones.  IMPRESSION: 1.  Appropriately positioned support apparatus as above. No pneumothorax. 2. Overall improved aeration of the lungs suggests improved atelectasis and pulmonary edema. Residual opacities may represent atelectasis though underlying infection not excluded.   Electronically Signed   By: Simonne Come M.D.   On: 02/08/2013 07:37  RLL airspace disease.   ASSESSMENT / PLAN:  Acute respiratory failure. Multifactorial: persistent aspiration pna, prob element of pulmonary edema, bilateral effusions,  Recurrent mucous plugging and ATX Volume overload rec Ct SBTs Cont diuresis with K repletion emipric vanc/merrem per primary  Plan for trach -get better sense of baseline from POA  SIRS/sepsis In setting of c diff,  aspiration pna, suspect Staph epidermis was contaminant  Plan: abx per primary service  After empiric ABX completed will need 10 days cdiff treatment past this  Acute renal failure  Lab Results  Component Value Date   CREATININE 1.04 02/09/2013   CREATININE 1.08 02/08/2013   CREATININE 1.10 02/07/2013    Recent Labs Lab 02/07/13 0633 02/08/13 0545 02/09/13 0725  NA 144 141 140   Anasarca  Hypokalemia Plan: Ct diuresis  Dysphagia  Plan NPO G tube feeds  Anemia  Recent Labs Lab 02/07/13 1440 02/08/13 0545 02/09/13 0725  HGB 11.2* 9.5* 9.4*  has sig drift in hgb. She does not have clear  evidence of bleeding.  rec Transfuse only for Hb <7 Follow h/h  H/P CP and MR plan Supportive care  Discussed on multidisciplinary rounds with select team -left message for POA to discuss Tstomy, will plan for wednesday   Cyril Mourning MD. [facility]. [place] [facility] Pager [phone] If no response call [phone]

## 2013-02-10 ENCOUNTER — Other Ambulatory Visit (HOSPITAL_COMMUNITY): Payer: Medicare Other

## 2013-02-10 LAB — CBC
MCH: 30.9 pg (ref 26.0–34.0)
MCV: 90.6 fL (ref 78.0–100.0)
Platelets: 236 10*3/uL (ref 150–400)
RDW: 16.2 % — ABNORMAL HIGH (ref 11.5–15.5)
WBC: 8.9 10*3/uL (ref 4.0–10.5)

## 2013-02-10 LAB — BASIC METABOLIC PANEL
CO2: 24 mEq/L (ref 19–32)
Calcium: 8.1 mg/dL — ABNORMAL LOW (ref 8.4–10.5)
Creatinine, Ser: 1.16 mg/dL — ABNORMAL HIGH (ref 0.50–1.10)
GFR calc non Af Amer: 45 mL/min — ABNORMAL LOW (ref >90)
Glucose, Bld: 117 mg/dL — ABNORMAL HIGH (ref 70–99)

## 2013-02-10 MED ORDER — MIDAZOLAM HCL 2 MG/2ML IJ SOLN
INTRAMUSCULAR | Status: AC
  Filled 2013-02-10: qty 2

## 2013-02-10 MED ORDER — FENTANYL CITRATE 0.05 MG/ML IJ SOLN
INTRAMUSCULAR | Status: AC | PRN
  Administered 2013-02-10: 50 ug via INTRAVENOUS

## 2013-02-10 MED ORDER — FENTANYL CITRATE 0.05 MG/ML IJ SOLN
INTRAMUSCULAR | Status: AC
  Filled 2013-02-10: qty 2

## 2013-02-10 MED ORDER — MIDAZOLAM HCL 2 MG/2ML IJ SOLN
INTRAMUSCULAR | Status: AC | PRN
  Administered 2013-02-10: 1 mg via INTRAVENOUS

## 2013-02-10 NOTE — H&P (Signed)
Referring Physician: Dr. Donley Redder HPI: Michelle Cardenas is an 78 y.o. female who presented for acute respiratory failure and septic shock s/p intubation. Blood cultures Staph epidermis, C. Difficile (+) patient underwent CT revealed diffuse colitis, consistent with history of C difficile colitis. Small rim enhancing fluid collections in right iliacus muscle measuring up to 3.6 cm, consistent with small abscesses. Request for image guided aspiration or drain placement.    Past Medical History: History reviewed. No pertinent past medical history.  Past Surgical History: No past surgical history on file.  Family History: No family history on file.  Social History:  has no tobacco, alcohol, and drug history on file.  Allergies: See chart.  Medications: See chart.  Please HPI for pertinent positives, otherwise complete 10 system ROS negative.  Physical Exam: There were no vitals taken for this visit. There is no height or weight on file to calculate BMI.   General Appearance:  Intubated, NAD  Head:  Normocephalic, without obvious abnormality, atraumatic  Lungs:   Rhonchi and decreased bases bilaterally.   Chest Wall:  No tenderness or deformity  Heart:  Regular/ irreg rate and rhythm, S1, S2 normal, no murmur, rub or gallop.  Abdomen:   Soft, non-tender, non distended, (+) BS  Extremities: Extremities edematous, atraumatic, no cyanosis or edema  Neurologic: Normal affect, no gross deficits.   Results for orders placed during the hospital encounter of 02/04/13 (from the past 48 hour(s))  VANCOMYCIN, TROUGH     Status: Abnormal   Collection Time    02/08/13  5:30 PM      Result Value Range   Vancomycin Tr 20.4 (*) 10.0 - 20.0 ug/mL  CBC     Status: Abnormal   Collection Time    02/09/13  7:25 AM      Result Value Range   WBC 12.0 (*) 4.0 - 10.5 K/uL   RBC 2.99 (*) 3.87 - 5.11 MIL/uL   Hemoglobin 9.4 (*) 12.0 - 15.0 g/dL   HCT 16.1 (*) 09.6 - 04.5 %   MCV 90.6  78.0 - 100.0 fL   MCH  31.4  26.0 - 34.0 pg   MCHC 34.7  30.0 - 36.0 g/dL   RDW 40.9 (*) 81.1 - 91.4 %   Platelets 241  150 - 400 K/uL  BASIC METABOLIC PANEL     Status: Abnormal   Collection Time    02/09/13  7:25 AM      Result Value Range   Sodium 140  135 - 145 mEq/L   Potassium 2.9 (*) 3.5 - 5.1 mEq/L   Chloride 101  96 - 112 mEq/L   CO2 27  19 - 32 mEq/L   Glucose, Bld 123 (*) 70 - 99 mg/dL   BUN 26 (*) 6 - 23 mg/dL   Creatinine, Ser 7.82  0.50 - 1.10 mg/dL   Calcium 8.1 (*) 8.4 - 10.5 mg/dL   GFR calc non Af Amer 51 (*) >90 mL/min   GFR calc Af Amer 59 (*) >90 mL/min   Comment: (NOTE)     The eGFR has been calculated using the CKD EPI equation.     This calculation has not been validated in all clinical situations.     eGFR's persistently <90 mL/min signify possible Chronic Kidney     Disease.  PROTIME-INR     Status: None   Collection Time    02/09/13 11:05 AM      Result Value Range   Prothrombin Time 14.0  11.6 -  15.2 seconds   INR 1.10  0.00 - 1.49  APTT     Status: None   Collection Time    02/09/13 11:05 AM      Result Value Range   aPTT 30  24 - 37 seconds  BASIC METABOLIC PANEL     Status: Abnormal   Collection Time    02/10/13  7:18 AM      Result Value Range   Sodium 138  135 - 145 mEq/L   Potassium 3.2 (*) 3.5 - 5.1 mEq/L   Chloride 103  96 - 112 mEq/L   CO2 24  19 - 32 mEq/L   Glucose, Bld 117 (*) 70 - 99 mg/dL   BUN 34 (*) 6 - 23 mg/dL   Creatinine, Ser 2.95 (*) 0.50 - 1.10 mg/dL   Calcium 8.1 (*) 8.4 - 10.5 mg/dL   GFR calc non Af Amer 45 (*) >90 mL/min   GFR calc Af Amer 52 (*) >90 mL/min   Comment: (NOTE)     The eGFR has been calculated using the CKD EPI equation.     This calculation has not been validated in all clinical situations.     eGFR's persistently <90 mL/min signify possible Chronic Kidney     Disease.  CBC     Status: Abnormal   Collection Time    02/10/13  7:18 AM      Result Value Range   WBC 8.9  4.0 - 10.5 K/uL   RBC 2.78 (*) 3.87 - 5.11  MIL/uL   Hemoglobin 8.6 (*) 12.0 - 15.0 g/dL   HCT 62.1 (*) 30.8 - 65.7 %   MCV 90.6  78.0 - 100.0 fL   MCH 30.9  26.0 - 34.0 pg   MCHC 34.1  30.0 - 36.0 g/dL   RDW 84.6 (*) 96.2 - 95.2 %   Platelets 236  150 - 400 K/uL   Ct Abdomen Pelvis W Contrast  02/09/2013   CLINICAL DATA:  Diffuse abdominal pain. GI bleeding. Pseudomembranous colitis.  EXAM: CT ABDOMEN AND PELVIS WITH CONTRAST  TECHNIQUE: Multidetector CT imaging of the abdomen and pelvis was performed using the standard protocol following bolus administration of intravenous contrast.  CONTRAST:  OMNIPAQUE IOHEXOL 300 MG/ML  SOLN  COMPARISON:  None.  FINDINGS: Images through the lung bases show small bilateral pleural effusions and bibasilar atelectasis. Cardiomegaly also noted.  A moderate size hiatal hernia is seen. The liver, spleen, pancreas, adrenal glands, and right kidney are normal in appearance. Left lower pole renal scarring noted, however there is no evidence of renal masses or hydronephrosis.  Gallbladder contains high attenuation sludge or contrast, however there is no evidence of acute cholecystitis. A percutaneous gastrostomy tube is seen in place. No evidence of bowel obstruction.  Diffuse colonic wall thickening is seen, consistent with diffuse colitis. Mild ascites and diffuse body wall edema also noted. In addition, there are rim enhancing fluid collections in the right iliacus muscle, largest measuring 3.6 cm on image 72. These are consistent with small abscesses. Beam hardening artifact through the inferior pelvis seen due to right hip prosthesis. Foley catheter seen within the urinary bladder which is decompressed.  IMPRESSION: Diffuse colitis, consistent with history of C difficile colitis.  Small rim enhancing fluid collections in right iliacus muscle measuring up to 3.6 cm, consistent with small abscesses.  Mild ascites and diffuse body wall edema. Small bilateral pleural effusions and bibasilar atelectasis also  noted.  Moderate size hiatal hernia.  Electronically Signed   By: Myles Rosenthal M.D.   On: 02/09/2013 00:50   Dg Chest Port 1 View  02/09/2013   CLINICAL DATA:  Respiratory failure, fluid overload  EXAM: PORTABLE CHEST - 1 VIEW  COMPARISON:  Portable chest x-ray of 02/08/2013  FINDINGS: The tip of the endotracheal tube is more difficult to visualize due to overlap of NG tube and central venous line as well as rotation. A straight film is recommended to assess position. There is haziness at both lung bases consistent with atelectasis and effusions and there may be mild pulmonary vascular congestion present. Cardiomegaly is stable.  IMPRESSION: 1. Due to rotation, the tip of the endotracheal tube cannot be evaluated. Recommend straighter film. 2. Pulmonary vascular congestion and basilar atelectasis with possible effusions.   Electronically Signed   By: Dwyane Dee M.D.   On: 02/09/2013 08:09    Assessment/Plan Right iliacus abscess. Request for image guided percutaneous aspiration versus drain placement. Images reviewed by Dr. Archer Asa, labs reviewed, patient has been NPO Risks and Benefits discussed with the patient's family. All of the patient's family questions were answered, patient is agreeable to proceed. Consent signed and in chart. Acute respiratory failure multifactorial s/p intubation. Septic shock, antibiotics wbc wnl, afebrile.    Pattricia Boss D PA-C 02/10/2013, 2:26 PM

## 2013-02-10 NOTE — Progress Notes (Signed)
PULMONARY  / CRITICAL CARE MEDICINE  Name: Michelle Cardenas MRN: [id number] DOB: October 30, 1934    ADMISSION DATE:  01/17/2013 CONSULTATION DATE:  11/19  REFERRING MD :  11/19 PRIMARY SERVICE:  [facility]   CHIEF COMPLAINT:   Respiratory failure   BRIEF PATIENT DESCRIPTION:   78 year old female w/ h/o CP, dysphagia and MR. Resides at [facility]. Admitted to outside hospital for acute respiratory failure and septic shock  in setting of PCM, aspiration PNA, staph epidermis bacteremia (was likely contaminant). She was  treated w/ broad spec abx, fluid resuscitation, and mechanical ventilation. Extubated at outside hospital. Transferred to [facility] on 11/18 for continued abx and persistent high FIO2 needs. PCCM asked to see on 11/19 for persistent respiratory failure   SIGNIFICANT EVENTS / STUDIES:    LINES / TUBES: Left [place] CVL>> oett 11/21>>>  CULTURES: Outside facility Blood cultures: staph epidermis Sputum: e coli and proteus C diff positive   ANTIBIOTICS: clinda 11/17>>>off Oral vanc 11/17>>> zyvox and cefepime 8 days outside facility Oral vanc X 12 d prior to admit deficid 10 d prior to admit   SUBJECTIVE:  Tol PS wean 5/5 , increased secretions per RT.  For IR perc abscess drain today.   VITAL SIGNS:   Vital signs reviewed. Abnormal values will appear under impression plan section.   PHYSICAL EXAMINATION: General:  Chronically ill appearing white female, NAD on vent  Neuro:  MAE x 4, int agitated, tracks HEENT:  No JVD Cardiovascular:  Reg irreg  Lungs:  Resps even non labored on PS 5/5, Scattered coarse rhonchi/ decreased bilat bases, copious oral secretions Abdomen:  Distended, firm to palp + bowel sounds  Musculoskeletal:  Intact Skin:  Diffuse anasarca    Recent Labs Lab 02/08/13 0545 02/09/13 0725 02/10/13 0718  NA 141 140 138  K 4.1 2.9* 3.2*  CL 106 101 103  CO2 25 27 24   BUN 27* 26* 34*  CREATININE 1.08 1.04 1.16*  GLUCOSE 135* 123* 117*    Recent Labs Lab  02/08/13 0545 02/09/13 0725 02/10/13 0718  HGB 9.5* 9.4* 8.6*  HCT 28.0* 27.1* 25.2*  WBC 14.2* 12.0* 8.9  PLT 209 241 236   ABG    Component Value Date/Time   PHART 7.486* 02/08/2013 1245   PCO2ART 35.4 02/08/2013 1245   PO2ART 96.1 02/08/2013 1245   HCO3 26.4* 02/08/2013 1245   TCO2 27.5 02/08/2013 1245   ACIDBASEDEF 0.7 02/03/2013 1620   O2SAT 97.9 02/08/2013 1245     Ct Abdomen Pelvis W Contrast  02/09/2013   CLINICAL DATA:  Diffuse abdominal pain. GI bleeding. Pseudomembranous colitis.  EXAM: CT ABDOMEN AND PELVIS WITH CONTRAST  TECHNIQUE: Multidetector CT imaging of the abdomen and pelvis was performed using the standard protocol following bolus administration of intravenous contrast.  CONTRAST:  OMNIPAQUE IOHEXOL 300 MG/ML  SOLN  COMPARISON:  None.  FINDINGS: Images through the lung bases show small bilateral pleural effusions and bibasilar atelectasis. Cardiomegaly also noted.  A moderate size hiatal hernia is seen. The liver, spleen, pancreas, adrenal glands, and right kidney are normal in appearance. Left lower pole renal scarring noted, however there is no evidence of renal masses or hydronephrosis.  Gallbladder contains high attenuation sludge or contrast, however there is no evidence of acute cholecystitis. A percutaneous gastrostomy tube is seen in place. No evidence of bowel obstruction.  Diffuse colonic wall thickening is seen, consistent with diffuse colitis. Mild ascites and diffuse body wall edema also noted. In addition, there are rim  enhancing fluid collections in the right iliacus muscle, largest measuring 3.6 cm on image 72. These are consistent with small abscesses. Beam hardening artifact through the inferior pelvis seen due to right hip prosthesis. Foley catheter seen within the urinary bladder which is decompressed.  IMPRESSION: Diffuse colitis, consistent with history of C difficile colitis.  Small rim enhancing fluid collections in right iliacus muscle  measuring up to 3.6 cm, consistent with small abscesses.  Mild ascites and diffuse body wall edema. Small bilateral pleural effusions and bibasilar atelectasis also noted.  Moderate size hiatal hernia.   Electronically Signed   By: Myles Rosenthal M.D.   On: 02/09/2013 00:50   Dg Chest Port 1 View  02/09/2013   CLINICAL DATA:  Respiratory failure, fluid overload  EXAM: PORTABLE CHEST - 1 VIEW  COMPARISON:  Portable chest x-ray of 02/08/2013  FINDINGS: The tip of the endotracheal tube is more difficult to visualize due to overlap of NG tube and central venous line as well as rotation. A straight film is recommended to assess position. There is haziness at both lung bases consistent with atelectasis and effusions and there may be mild pulmonary vascular congestion present. Cardiomegaly is stable.  IMPRESSION: 1. Due to rotation, the tip of the endotracheal tube cannot be evaluated. Recommend straighter film. 2. Pulmonary vascular congestion and basilar atelectasis with possible effusions.   Electronically Signed   By: Dwyane Dee M.D.   On: 02/09/2013 08:09  RLL airspace disease.   ASSESSMENT / PLAN:  Acute respiratory failure. Multifactorial: persistent aspiration pna, prob element of pulmonary edema, bilateral effusions,  Recurrent mucous plugging and ATX Volume overload rec Cont PS wean as tol  Cont diuresis with K repletion emipric vanc/merrem per primary  Per my discussion with POA Pam Hubbard -does not want trach at this time (since changes dispo) , but wants to cont full code, aggressive care -- given copious secretions, marginal mental status & IR procedure planned today would hold off on trial of extubation for now, especially given planned IR procedure.   SIRS/sepsis In setting of c diff,  aspiration pna, suspect Staph epidermis was contaminant  Plan: abx per primary service  After empiric ABX completed will need 10 days cdiff treatment past this  Acute renal failure  Lab Results   Component Value Date   CREATININE 1.16* 02/10/2013   CREATININE 1.04 02/09/2013   CREATININE 1.08 02/08/2013    Recent Labs Lab 02/08/13 0545 02/09/13 0725 02/10/13 0718  NA 141 140 138   Anasarca  Hypokalemia Plan: Ct diuresis  Dysphagia  abd abscesses  Plan NPO G tube feeds IR perc drains 11/26   Anemia  Recent Labs Lab 02/08/13 0545 02/09/13 0725 02/10/13 0718  HGB 9.5* 9.4* 8.6*  rec Transfuse only for Hb <7 Follow h/h Per primary   H/P CP and MR plan Supportive care    [facility], NP 02/10/2013  10:02 AM Pager: [phone] or [phone]  *Care during the described time interval was provided by me and/or other providers on the critical care team. I have reviewed this patient's available data, including medical history, events of note, physical examination and test results as part of my evaluation.  Discussed on multidisciplinary rounds with select team  [name],RAKESH V.

## 2013-02-10 NOTE — Procedures (Signed)
R iliacus fluid collection aspiration yields a few drop of old blood. No comp

## 2013-02-11 LAB — BASIC METABOLIC PANEL
BUN: 37 mg/dL — ABNORMAL HIGH (ref 6–23)
Calcium: 8.2 mg/dL — ABNORMAL LOW (ref 8.4–10.5)
Chloride: 101 mEq/L (ref 96–112)
Glucose, Bld: 94 mg/dL (ref 70–99)
Potassium: 2.8 mEq/L — ABNORMAL LOW (ref 3.5–5.1)

## 2013-02-12 LAB — BASIC METABOLIC PANEL
BUN: 38 mg/dL — ABNORMAL HIGH (ref 6–23)
CO2: 28 mEq/L (ref 19–32)
Calcium: 7.8 mg/dL — ABNORMAL LOW (ref 8.4–10.5)
Chloride: 100 mEq/L (ref 96–112)
Creatinine, Ser: 1.03 mg/dL (ref 0.50–1.10)
GFR calc Af Amer: 60 mL/min — ABNORMAL LOW (ref >90)
Glucose, Bld: 136 mg/dL — ABNORMAL HIGH (ref 70–99)
Potassium: 2.5 mEq/L — CL (ref 3.5–5.1)

## 2013-02-12 LAB — CBC
HCT: 24.6 % — ABNORMAL LOW (ref 36.0–46.0)
Hemoglobin: 8.3 g/dL — ABNORMAL LOW (ref 12.0–15.0)
MCH: 30.5 pg (ref 26.0–34.0)
MCHC: 33.7 g/dL (ref 30.0–36.0)
MCV: 90.4 fL (ref 78.0–100.0)
RDW: 16.3 % — ABNORMAL HIGH (ref 11.5–15.5)

## 2013-02-12 LAB — VANCOMYCIN, TROUGH: Vancomycin Tr: 21 ug/mL — ABNORMAL HIGH (ref 10.0–20.0)

## 2013-02-13 ENCOUNTER — Other Ambulatory Visit (HOSPITAL_COMMUNITY): Payer: Medicare Other

## 2013-02-13 LAB — BASIC METABOLIC PANEL
CO2: 28 mEq/L (ref 19–32)
Calcium: 8.4 mg/dL (ref 8.4–10.5)
Chloride: 100 mEq/L (ref 96–112)
GFR calc Af Amer: 65 mL/min — ABNORMAL LOW (ref >90)
GFR calc non Af Amer: 56 mL/min — ABNORMAL LOW (ref >90)
Potassium: 3.9 mEq/L (ref 3.5–5.1)
Sodium: 139 mEq/L (ref 135–145)

## 2013-02-13 LAB — CBC
MCHC: 33.2 g/dL (ref 30.0–36.0)
Platelets: 380 10*3/uL (ref 150–400)
RBC: 3 MIL/uL — ABNORMAL LOW (ref 3.87–5.11)
WBC: 6.7 10*3/uL (ref 4.0–10.5)

## 2013-02-13 LAB — IRON AND TIBC
Iron: 32 ug/dL — ABNORMAL LOW (ref 42–135)
Saturation Ratios: 24 % (ref 20–55)
TIBC: 135 ug/dL — ABNORMAL LOW (ref 250–470)

## 2013-02-13 LAB — RETICULOCYTES
Retic Count, Absolute: 57 10*3/uL (ref 19.0–186.0)
Retic Ct Pct: 1.9 % (ref 0.4–3.1)

## 2013-02-13 LAB — CULTURE, ROUTINE-ABSCESS: Gram Stain: NONE SEEN

## 2013-02-13 LAB — FERRITIN: Ferritin: 366 ng/mL — ABNORMAL HIGH (ref 10–291)

## 2013-02-14 LAB — BASIC METABOLIC PANEL
BUN: 36 mg/dL — ABNORMAL HIGH (ref 6–23)
CO2: 29 mEq/L (ref 19–32)
Chloride: 103 mEq/L (ref 96–112)
Creatinine, Ser: 0.9 mg/dL (ref 0.50–1.10)
GFR calc non Af Amer: 61 mL/min — ABNORMAL LOW (ref >90)
Glucose, Bld: 128 mg/dL — ABNORMAL HIGH (ref 70–99)
Potassium: 3.5 mEq/L (ref 3.5–5.1)
Sodium: 138 mEq/L (ref 135–145)

## 2013-02-15 LAB — BASIC METABOLIC PANEL
BUN: 34 mg/dL — ABNORMAL HIGH (ref 6–23)
CO2: 28 mEq/L (ref 19–32)
Chloride: 104 mEq/L (ref 96–112)
Creatinine, Ser: 0.82 mg/dL (ref 0.50–1.10)
GFR calc Af Amer: 79 mL/min — ABNORMAL LOW (ref >90)
GFR calc non Af Amer: 68 mL/min — ABNORMAL LOW (ref >90)
Potassium: 3.4 mEq/L — ABNORMAL LOW (ref 3.5–5.1)

## 2013-02-15 NOTE — Progress Notes (Signed)
PULMONARY  / CRITICAL CARE MEDICINE  Name: Michelle Cardenas MRN: [id number] DOB: 1934-05-06    ADMISSION DATE:  Mar 03, 2013 CONSULTATION DATE:  11/19  REFERRING MD :  11/19 PRIMARY SERVICE:  [facility]   CHIEF COMPLAINT:   Respiratory failure   BRIEF PATIENT DESCRIPTION:   78 year old female w/ h/o CP, dysphagia and MR. Resides at [facility]. Admitted to outside hospital for acute respiratory failure and septic shock  in setting of PCM, aspiration PNA, staph epidermis bacteremia (was likely contaminant). She was  treated w/ broad spec abx, fluid resuscitation, and mechanical ventilation. Extubated at outside hospital. Transferred to [facility] on 11/18 for continued abx and persistent high FIO2 needs. PCCM asked to see on 11/19 for persistent respiratory failure   SIGNIFICANT EVENTS / STUDIES:  CT abd 11/25 Small rim enhancing fluid collections in right iliacus muscle measuring up to 3.6 cm, consistent with small abscesses.Mild ascites and diffuse body wall edema. Small bilateral pleural effusions and bibasilar atelectasis also noted. CT guided needle asp 11/26: small amt blood c/w hematoma   LINES / TUBES: Left [place] CVL>> oett 11/21>>>  CULTURES: Outside facility Blood cultures: staph epidermis Sputum: e coli and proteus C diff positive  Abscess 11/26: neg  ANTIBIOTICS: clinda 11/17>>>off Oral vanc 11/17>>> zyvox and cefepime 8 days outside facility Oral vanc X 12 d prior to admit deficid 10 d prior to admit   SUBJECTIVE:  Tol PS wean 5/5 , increased secretions per RT.  For IR perc abscess drain today.   VITAL SIGNS:   Vital signs reviewed. Abnormal values will appear under impression plan section.   PHYSICAL EXAMINATION: General:  Chronically ill appearing white female, NAD on vent  Neuro:  MAE x 4, int agitated, tracks HEENT:  No JVD Cardiovascular:  Reg irreg  Lungs:  Resps even non labored on PS 5/5, Scattered coarse rhonchi/ decreased bilat bases, copious oral secretions Abdomen:   Distended, firm to palp + bowel sounds  Musculoskeletal:  Intact Skin:  Diffuse anasarca    Recent Labs Lab 02/13/13 0530 02/14/13 0549 02/15/13 0743  NA 139 138 141  K 3.9 3.5 3.4*  CL 100 103 104  CO2 28 29 28   BUN 37* 36* 34*  CREATININE 0.96 0.90 0.82  GLUCOSE 134* 128* 165*    Recent Labs Lab 02/10/13 0718 02/12/13 0520 02/13/13 0530  HGB 8.6* 8.3* 9.2*  HCT 25.2* 24.6* 27.7*  WBC 8.9 5.7 6.7  PLT 236 285 380   ABG    Component Value Date/Time   PHART 7.486* 02/08/2013 1245   PCO2ART 35.4 02/08/2013 1245   PO2ART 96.1 02/08/2013 1245   HCO3 26.4* 02/08/2013 1245   TCO2 27.5 02/08/2013 1245   ACIDBASEDEF 0.7 02/03/2013 1620   O2SAT 97.9 02/08/2013 1245     No results found.RLL airspace disease.   ASSESSMENT / PLAN:  Acute respiratory failure. Multifactorial: persistent aspiration pna, prob element of pulmonary edema, bilateral effusions,  Recurrent mucous plugging and ATX Volume overload She has acceptable vent mechanics but the bigger issue is deconditioning and secretion management w/ mucous plugging. She needs a trach if we are going to have any hope of getting her recovered.  rec Cont PS wean as tol  Cont diuresis with K repletion emipric vanc/merrem per primary  Will need to re-visit trach   SIRS/sepsis In setting of c diff,  aspiration pna, suspect Staph epidermis was contaminant  Plan: abx per primary service  After empiric ABX completed will need 10 days cdiff treatment past  this  Acute renal failure  Lab Results  Component Value Date   CREATININE 0.82 02/15/2013   CREATININE 0.90 02/14/2013   CREATININE 0.96 02/13/2013    Recent Labs Lab 02/13/13 0530 02/14/13 0549 02/15/13 0743  NA 139 138 141   Anasarca  Hypokalemia Plan: Ct diuresis  Dysphagia  abd abscesses  Plan NPO G tube feeds IR perc drains 11/26   Anemia  Recent Labs Lab 02/10/13 0718 02/12/13 0520 02/13/13 0530  HGB 8.6* 8.3* 9.2*  rec Transfuse  only for Hb <7 Follow h/h Per primary   H/P CP and MR plan Supportive care    BABCOCK,PETE  Weaning to 12/5 today, will attempt 10/5, needs discussion with family whether or not would want trach.  If able to extubate over the next few days then will extubate but if not then will require trach unless family does not wish for that.  Patient seen and examined, agree with above note.  I dictated the care and orders written for this patient under my direction.  [name], MD [phone]

## 2013-02-15 DEATH — deceased

## 2013-02-16 ENCOUNTER — Other Ambulatory Visit (HOSPITAL_COMMUNITY): Payer: Medicare Other

## 2013-02-16 NOTE — Progress Notes (Signed)
PULMONARY  / CRITICAL CARE MEDICINE  Name: Michelle Cardenas MRN: [id number] DOB: Aug 01, 1934    ADMISSION DATE:  01/25/2013 CONSULTATION DATE:  11/19  REFERRING MD :  11/19 PRIMARY SERVICE:  [facility]   CHIEF COMPLAINT:   Respiratory failure   BRIEF PATIENT DESCRIPTION:   78 year old female w/ h/o CP, dysphagia and MR. Resides at [facility]. Admitted to outside hospital for acute respiratory failure and septic shock  in setting of PCM, aspiration PNA, staph epidermis bacteremia (was likely contaminant). She was  treated w/ broad spec abx, fluid resuscitation, and mechanical ventilation. Extubated at outside hospital. Transferred to [facility] on 11/18 for continued abx and persistent high FIO2 needs. PCCM asked to see on 11/19 for persistent respiratory failure   SIGNIFICANT EVENTS / STUDIES:  CT abd 11/25 Small rim enhancing fluid collections in right iliacus muscle measuring up to 3.6 cm, consistent with small abscesses.Mild ascites and diffuse body wall edema. Small bilateral pleural effusions and bibasilar atelectasis also noted. CT guided needle asp 11/26: small amt blood c/w hematoma   LINES / TUBES: Left [place] CVL>> oett 11/21>>>  CULTURES: Outside facility Blood cultures: staph epidermis Sputum: e coli and proteus C diff positive  Abscess 11/26: neg  ANTIBIOTICS: clinda 11/17>>>off Oral vanc 11/17>>> zyvox and cefepime 8 days outside facility Oral vanc X 12 d prior to admit deficid 10 d prior to admit   SUBJECTIVE:  Tol PS wean 5/5 , increased secretions per RT.  For IR perc abscess drain today.   VITAL SIGNS:   Vital signs reviewed. Abnormal values will appear under impression plan section.   PHYSICAL EXAMINATION: General:  Chronically ill appearing white female, NAD on vent  Neuro:  MAE x 4, int agitated, tracks HEENT:  No JVD Cardiovascular:  Reg irreg  Lungs:  Resps even non labored on PS 5/5, Scattered coarse rhonchi/ decreased bilat bases, copious oral secretions Abdomen:   Distended, firm to palp + bowel sounds  Musculoskeletal:  Intact Skin:  Diffuse anasarca    Recent Labs Lab 02/13/13 0530 02/14/13 0549 02/15/13 0743  NA 139 138 141  K 3.9 3.5 3.4*  CL 100 103 104  CO2 28 29 28   BUN 37* 36* 34*  CREATININE 0.96 0.90 0.82  GLUCOSE 134* 128* 165*    Recent Labs Lab 02/10/13 0718 02/12/13 0520 02/13/13 0530  HGB 8.6* 8.3* 9.2*  HCT 25.2* 24.6* 27.7*  WBC 8.9 5.7 6.7  PLT 236 285 380   ABG    Component Value Date/Time   PHART 7.486* 02/08/2013 1245   PCO2ART 35.4 02/08/2013 1245   PO2ART 96.1 02/08/2013 1245   HCO3 26.4* 02/08/2013 1245   TCO2 27.5 02/08/2013 1245   ACIDBASEDEF 0.7 02/03/2013 1620   O2SAT 97.9 02/08/2013 1245     Dg Chest Port 1 View  02/16/2013   CLINICAL DATA:  Respiratory failure.  EXAM: PORTABLE CHEST - 1 VIEW  COMPARISON:  Chest radiograph 02/09/2013.  FINDINGS: Patient is rotated to the left. ET tube terminates approximately 4.2 cm superior to the carina. Left sided central venous catheter tip projects over the superior vena cava. Stable of enlarged cardiac and mediastinal contours. Persistent hazy opacities projecting over the bilateral mid and lower lungs. No definite pneumothorax. Grossly unremarkable Regional skeleton.  IMPRESSION: 1. Limited due to rotation however the ET tube appears to terminate at the level of the mid trachea. 2. Cardiomegaly with bilateral hazy mid and lower lung opacities likely small effusions and underlying atelectasis.   Electronically Signed  By: Annia Belt M.D.   On: 02/16/2013 08:39  RLL airspace disease.   ASSESSMENT / PLAN:  Acute respiratory failure. Multifactorial: persistent aspiration pna, prob element of pulmonary edema, bilateral effusions,  Recurrent mucous plugging and ATX Volume overload She has acceptable vent mechanics but the bigger issue is deconditioning and secretion management w/ mucous plugging. She needs a trach if we are going to have any hope of getting her  recovered.  rec Cont PS wean as tol  Cont diuresis with K repletion Emipric vanc/merrem per primary  Family coming in today at 5:30 and will discuss trach, discussed with primary, once ready we will proceed with trach/peg.  SIRS/sepsis In setting of c diff,  aspiration pna, suspect Staph epidermis was contaminant  Plan: Abx per primary service  After empiric ABX completed will need 10 days cdiff treatment past this  Acute renal failure  Lab Results  Component Value Date   CREATININE 0.82 02/15/2013   CREATININE 0.90 02/14/2013   CREATININE 0.96 02/13/2013    Recent Labs Lab 02/13/13 0530 02/14/13 0549 02/15/13 0743  NA 139 138 141   Anasarca  Hypokalemia Plan: Ct diuresis  Dysphagia  abd abscesses  Plan G tube feeds IR perc drains 11/26  Anemia  Recent Labs Lab 02/10/13 0718 02/12/13 0520 02/13/13 0530  HGB 8.6* 8.3* 9.2*  rec Transfuse only for Hb <7 Follow h/h Per primary   H/P CP and MR plan Supportive care  [name], M.D. [facility] Pulmonary/Critical Care Medicine. Pager: [phone]. After hours pager: [phone].

## 2013-02-17 ENCOUNTER — Other Ambulatory Visit (HOSPITAL_COMMUNITY): Payer: Medicare Other

## 2013-02-17 LAB — PRO B NATRIURETIC PEPTIDE: Pro B Natriuretic peptide (BNP): 10477 pg/mL — ABNORMAL HIGH (ref 0–450)

## 2013-02-17 LAB — BASIC METABOLIC PANEL
CO2: 26 mEq/L (ref 19–32)
GFR calc Af Amer: >90 mL/min (ref >90)
Glucose, Bld: 144 mg/dL — ABNORMAL HIGH (ref 70–99)
Potassium: 4.1 mEq/L (ref 3.5–5.1)
Sodium: 144 mEq/L (ref 135–145)

## 2013-02-17 LAB — CBC
Hemoglobin: 8.1 g/dL — ABNORMAL LOW (ref 12.0–15.0)
MCH: 31.2 pg (ref 26.0–34.0)
Platelets: 456 10*3/uL — ABNORMAL HIGH (ref 150–400)
RBC: 2.6 MIL/uL — ABNORMAL LOW (ref 3.87–5.11)
WBC: 10.7 10*3/uL — ABNORMAL HIGH (ref 4.0–10.5)

## 2013-02-17 NOTE — Progress Notes (Signed)
PULMONARY  / CRITICAL CARE MEDICINE  Name: Michelle Cardenas MRN: [id number] DOB: 07/01/1934    ADMISSION DATE:  02/23/13 CONSULTATION DATE:  11/19  REFERRING MD :  11/19 PRIMARY SERVICE:  [facility]   CHIEF COMPLAINT:   Respiratory failure   BRIEF PATIENT DESCRIPTION:   78 year old female w/ h/o CP, dysphagia and MR. Resides at [facility]. Admitted to outside hospital for acute respiratory failure and septic shock  in setting of PCM, aspiration PNA, staph epidermis bacteremia (was likely contaminant). She was  treated w/ broad spec abx, fluid resuscitation, and mechanical ventilation. Extubated at outside hospital. Transferred to [facility] on 11/18 for continued abx and persistent high FIO2 needs. PCCM asked to see on 11/19 for persistent respiratory failure   SIGNIFICANT EVENTS / STUDIES:  CT abd 11/25 Small rim enhancing fluid collections in right iliacus muscle measuring up to 3.6 cm, consistent with small abscesses.Mild ascites and diffuse body wall edema. Small bilateral pleural effusions and bibasilar atelectasis also noted. CT guided needle asp 11/26: small amt blood c/w hematoma   LINES / TUBES: Left [place] CVL>> oett 11/21>>>  CULTURES: Outside facility Blood cultures: staph epidermis Sputum: e coli and proteus C diff positive  Abscess 11/26: neg  ANTIBIOTICS: clinda 11/17>>>off Oral vanc 11/17>>> zyvox and cefepime 8 days outside facility Oral vanc X 12 d prior to admit deficid 10 d prior to admit   SUBJECTIVE:  Tol PS wean 5/5 , increased secretions per RT.  For IR perc abscess drain today.   VITAL SIGNS:   Vital signs reviewed. Abnormal values will appear under impression plan section.   PHYSICAL EXAMINATION: General:  Chronically ill appearing white female, NAD on vent  Neuro:  MAE x 4, int agitated, tracks HEENT:  No JVD Cardiovascular:  Reg irreg  Lungs:  Resps even non labored on PS 5/5, Scattered coarse rhonchi/ decreased bilat bases, copious oral secretions Abdomen:   Distended, firm to palp + bowel sounds  Musculoskeletal:  Intact Skin:  Diffuse anasarca    Recent Labs Lab 02/14/13 0549 02/15/13 0743 02/17/13 0615  NA 138 141 144  K 3.5 3.4* 4.1  CL 103 104 108  CO2 29 28 26   BUN 36* 34* 36*  CREATININE 0.90 0.82 0.78  GLUCOSE 128* 165* 144*    Recent Labs Lab 02/12/13 0520 02/13/13 0530 02/17/13 0615  HGB 8.3* 9.2* 8.1*  HCT 24.6* 27.7* 23.7*  WBC 5.7 6.7 10.7*  PLT 285 380 456*   ABG    Component Value Date/Time   PHART 7.486* 02/08/2013 1245   PCO2ART 35.4 02/08/2013 1245   PO2ART 96.1 02/08/2013 1245   HCO3 26.4* 02/08/2013 1245   TCO2 27.5 02/08/2013 1245   ACIDBASEDEF 0.7 02/03/2013 1620   O2SAT 97.9 02/08/2013 1245     Dg Chest Port 1 View  02/17/2013   CLINICAL DATA:  Respiratory failure.  EXAM: PORTABLE CHEST - 1 VIEW  COMPARISON:  February 16, 2013.  FINDINGS: The lungs are reasonably well inflated. There is likely layering pleural fluid posteriorly and this semi erect patient. This appearance is unchanged. The left hemidiaphragm remains obscured. The cardiopericardial silhouette is top-normal in size. The pulmonary vascularity is indistinct. The left subclavian venous catheter tip lies in the region of the distal SVC. The endotracheal tube tip lies approximately 3 cm above the crotch of the carina. No pneumothorax is evident. The right lateral costophrenic gutter is excluded from the study.  IMPRESSION: There has not been significant interval change in the appearance of  the chest since yesterday's study. There are likely moderate to large bilateral pleural effusions layering posteriorly. Left lower lobe atelectasis is likely present and unchanged.   Electronically Signed   By: David  [place]   On: 02/17/2013 07:48   Dg Chest Port 1 View  02/16/2013   CLINICAL DATA:  Respiratory failure.  EXAM: PORTABLE CHEST - 1 VIEW  COMPARISON:  Chest radiograph 02/09/2013.  FINDINGS: Patient is rotated to the left. ET tube terminates  approximately 4.2 cm superior to the carina. Left sided central venous catheter tip projects over the superior vena cava. Stable of enlarged cardiac and mediastinal contours. Persistent hazy opacities projecting over the bilateral mid and lower lungs. No definite pneumothorax. Grossly unremarkable Regional skeleton.  IMPRESSION: 1. Limited due to rotation however the ET tube appears to terminate at the level of the mid trachea. 2. Cardiomegaly with bilateral hazy mid and lower lung opacities likely small effusions and underlying atelectasis.   Electronically Signed   By: Annia Belt M.D.   On: 02/16/2013 08:39   Dg Abd Portable 1v  02/17/2013   CLINICAL DATA:  History of ileus  EXAM: PORTABLE ABDOMEN - 1 VIEW  COMPARISON:  February 13, 2013.  FINDINGS: The bowel gas pattern is relatively normal in appearance. There is a small amount of gas in the rectum. There is gas within the colon and very small amounts noted in the small bowel. The volume of gas present has decreased since the previous studies. A gastrostomy tube is in place and appears unchanged. There is curvature of the lumbar spine with the convexity toward the left. There is calcification in the wall of the lower abdominal aorta. There is a prosthetic right hip joint. As best as can be determined the lung bases are clear.  IMPRESSION: The bowel gas pattern has improved in appearance since the previous study and is within the limits of normal today.   Electronically Signed   By: David  [place]   On: 02/17/2013 07:50  RLL airspace disease.   ASSESSMENT / PLAN:  Acute respiratory failure. Multifactorial: persistent aspiration pna, prob element of pulmonary edema, bilateral effusions,  Recurrent mucous plugging and ATX Volume overload She has acceptable vent mechanics but the bigger issue is deconditioning and secretion management w/ mucous plugging. She needs a trach if we are going to have any hope of getting her recovered.  rec Cont PS wean as  tol  Cont diuresis with K repletion Emipric vanc/merrem per primary  Family coming in today at 5:30 and will discuss trach, discussed with primary, once ready we will proceed with trach/peg.  SIRS/sepsis In setting of c diff,  aspiration pna, suspect Staph epidermis was contaminant  Plan: Abx per primary service  After empiric ABX completed will need 10 days cdiff treatment past this  Acute renal failure  Lab Results  Component Value Date   CREATININE 0.78 02/17/2013   CREATININE 0.82 02/15/2013   CREATININE 0.90 02/14/2013    Recent Labs Lab 02/14/13 0549 02/15/13 0743 02/17/13 0615  NA 138 141 144   Anasarca  Hypokalemia Plan: Ct diuresis  Dysphagia  abd abscesses  Plan G tube feeds IR perc drains 11/26  Anemia  Recent Labs Lab 02/12/13 0520 02/13/13 0530 02/17/13 0615  HGB 8.3* 9.2* 8.1*  rec Transfuse only for Hb <7 Follow h/h Per primary   H/P CP and MR plan Supportive care  Family to arrive today for discussion regarding trach, will see again tomorrow to see if trach is  what the family desires.  [name], M.D. [facility] Pulmonary/Critical Care Medicine. Pager: [phone]. After hours pager: [phone].

## 2013-02-19 ENCOUNTER — Ambulatory Visit (HOSPITAL_COMMUNITY): Payer: Medicare Other

## 2013-02-19 ENCOUNTER — Other Ambulatory Visit (HOSPITAL_COMMUNITY): Payer: Medicare Other

## 2013-02-19 ENCOUNTER — Institutional Professional Consult (permissible substitution) (HOSPITAL_COMMUNITY): Payer: Medicare Other

## 2013-02-19 LAB — BASIC METABOLIC PANEL
BUN: 36 mg/dL — ABNORMAL HIGH (ref 6–23)
CO2: 25 mEq/L (ref 19–32)
Creatinine, Ser: 0.78 mg/dL (ref 0.50–1.10)
GFR calc Af Amer: >90 mL/min (ref >90)
GFR calc non Af Amer: 79 mL/min — ABNORMAL LOW (ref >90)
Sodium: 141 mEq/L (ref 135–145)

## 2013-02-19 LAB — LACTATE DEHYDROGENASE, PLEURAL OR PERITONEAL FLUID: LD, Fluid: 83 U/L — ABNORMAL HIGH (ref 3–23)

## 2013-02-19 LAB — BODY FLUID CELL COUNT WITH DIFFERENTIAL
Eos, Fluid: 0 %
Lymphs, Fluid: 11 %
Monocyte-Macrophage-Serous Fluid: 74 % (ref 50–90)

## 2013-02-19 LAB — CBC
MCHC: 31.9 g/dL (ref 30.0–36.0)
RDW: 16.8 % — ABNORMAL HIGH (ref 11.5–15.5)

## 2013-02-19 NOTE — Progress Notes (Signed)
PULMONARY  / CRITICAL CARE MEDICINE  Name: Michelle Cardenas MRN: [id number] DOB: 04-26-1934    ADMISSION DATE:  02-19-13 CONSULTATION DATE:  11/19  REFERRING MD :  11/19 PRIMARY SERVICE:  [facility]   CHIEF COMPLAINT:   Respiratory failure   BRIEF PATIENT DESCRIPTION:   78 year old female w/ h/o CP, dysphagia and MR. Resides at [facility]. Admitted to outside hospital for acute respiratory failure and septic shock  in setting of PCM, aspiration PNA, staph epidermis bacteremia (was likely contaminant). She was  treated w/ broad spec abx, fluid resuscitation, and mechanical ventilation. Extubated at outside hospital. Transferred to [facility] on 11/18 for continued abx and persistent high FIO2 needs. PCCM asked to see on 11/19 for persistent respiratory failure   SIGNIFICANT EVENTS / STUDIES:  CT abd 11/25 Small rim enhancing fluid collections in right iliacus muscle measuring up to 3.6 cm, consistent with small abscesses.Mild ascites and diffuse body wall edema. Small bilateral pleural effusions and bibasilar atelectasis also noted. CT guided needle asp 11/26: small amt blood c/w hematoma   LINES / TUBES: Left [place] CVL>> oett 11/21>>>  CULTURES: Outside facility Blood cultures: staph epidermis Sputum: e coli and proteus C diff positive  Abscess 11/26: neg  ANTIBIOTICS: clinda 11/17>>>off Oral vanc 11/17>>> zyvox and cefepime 8 days outside facility Oral vanc X 12 d prior to admit deficid 10 d prior to admit   SUBJECTIVE:  Family wishes for trach/peg.  VITAL SIGNS:   Vital signs reviewed. Abnormal values will appear under impression plan section.   PHYSICAL EXAMINATION: General:  Chronically ill appearing white female, NAD on vent  Neuro:  MAE x 4, int agitated, tracks HEENT:  No JVD Cardiovascular:  Reg irreg  Lungs:  Resps even non labored on PS 5/5, Scattered coarse rhonchi/ decreased bilat bases, copious oral secretions Abdomen:  Distended, firm to palp + bowel sounds  Musculoskeletal:   Intact Skin:  Diffuse anasarca    Recent Labs Lab 02/15/13 0743 02/17/13 0615 02/19/13 0715  NA 141 144 141  K 3.4* 4.1 4.4  CL 104 108 107  CO2 28 26 25   BUN 34* 36* 36*  CREATININE 0.82 0.78 0.78  GLUCOSE 165* 144* 131*    Recent Labs Lab 02/13/13 0530 02/17/13 0615 02/19/13 1051  HGB 9.2* 8.1* 8.0*  HCT 27.7* 23.7* 25.1*  WBC 6.7 10.7* 12.9*  PLT 380 456* 407*   ABG    Component Value Date/Time   PHART 7.486* 02/08/2013 1245   PCO2ART 35.4 02/08/2013 1245   PO2ART 96.1 02/08/2013 1245   HCO3 26.4* 02/08/2013 1245   TCO2 27.5 02/08/2013 1245   ACIDBASEDEF 0.7 02/03/2013 1620   O2SAT 97.9 02/08/2013 1245     Dg Chest Port 1 View  02/19/2013   CLINICAL DATA:  Respiratory failure  EXAM: PORTABLE CHEST - 1 VIEW  COMPARISON:  February 17, 2013.  FINDINGS: The lungs are borderline hypoinflated. There is a small to moderate sized right pleural effusion and likely a small left pleural effusion. The cardiopericardial silhouette is enlarged. The pulmonary vascularity is engorged. The left hemidiaphragm remains obscured.  The endotracheal tube tip lies 5.5 cm above the crotch of the carina. The left subclavian venous catheter tip lies in the region of the distal SVC. No acute bony abnormalities are demonstrated.  IMPRESSION: The fIndings suggest congestive heart failure with pulmonary interstitial edema and bilateral pleural effusions. Certainly basilar pneumonia could be present and the obscured. When compared to the study of 17 February 2013 there has  not been significant interval change.   Electronically Signed   By: David  [place]   On: 02/19/2013 08:33  RLL airspace disease.   ASSESSMENT / PLAN:  Acute respiratory failure. Multifactorial: persistent aspiration pna, prob element of pulmonary edema, bilateral effusions,  Recurrent mucous plugging and ATX Volume overload She has acceptable vent mechanics but the bigger issue is deconditioning and secretion management w/ mucous  plugging. She needs a trach if we are going to have any hope of getting her recovered.  rec - Cont PS wean as tol  - Cont diuresis with K repletion - Emipric vanc/merrem per primary  - Trach today and thora today.  SIRS/sepsis In setting of c diff,  aspiration pna, suspect Staph epidermis was contaminant  Plan: - Abx per primary service. - After empiric ABX completed will need 10 days cdiff treatment past this.  Acute renal failure  Lab Results  Component Value Date   CREATININE 0.78 02/19/2013   CREATININE 0.78 02/17/2013   CREATININE 0.82 02/15/2013    Recent Labs Lab 02/15/13 0743 02/17/13 0615 02/19/13 0715  NA 141 144 141   Anasarca  Hypokalemia Plan: Ct diuresis  Dysphagia  abd abscesses  Plan - G tube feeds. - IR perc drains 11/26.  Anemia  Recent Labs Lab 02/13/13 0530 02/17/13 0615 02/19/13 1051  HGB 9.2* 8.1* 8.0*  rec - Transfuse only for Hb <7. - Follow h/h. - Per primary.  H/P CP and MR plan Supportive care  Discussed with family, interested in trach/peg.  Will proceed with thora today then trach this afternoon.  CC time 35 min.  [name], M.D. [facility] Pulmonary/Critical Care Medicine. Pager: [phone]. After hours pager: [phone].

## 2013-02-19 NOTE — Procedures (Signed)
Bedside Tracheostomy Insertion Procedure Note   Patient Details:   Name: Michelle Cardenas DOB: 05-11-1934 MRN: [id number]  Procedure: Tracheostomy  Pre Procedure Assessment: ET Tube Size: 7.5 ET Tube secured at lip (cm):23 Bite block in place: No, pharmaceutically paralyzed Breath Sounds: Rhonch  Post Procedure Assessment: BP 159/105  Pulse 81  Resp 13  SpO2 91% O2 sats: stable throughout Complications: No apparent complications Patient did tolerate procedure well Tracheostomy Brand:Shiley Tracheostomy Style:Cuffed Tracheostomy Size: 6 Tracheostomy Secured NWG:[id number], velcro Tracheostomy Placement Confirmation:Trach cuff visualized and in place and Chest X ray ordered for placement    Jacqulynn Cadet 02/19/2013, 2:58 PM

## 2013-02-19 NOTE — Procedures (Signed)
Bedside Percutaneous Tracheostomy Placement  Consent from DPOA, patient cleaned, sedated, paralyzed and positioned.  Skin incision done followed by blunt dissection.  Small vein visualized and was tied and cut.  After dissection was completed, needle inserted into the airway and catheter advanced and visualized bronchoscopically.  Wire placed and seen bronchoscopically.  Airway crushed and dilated.  Tracheostomy size 6 cuffed shiley placed and visualized bronchoscopically well above carina.  CXR ordered and pending.  [name], M.D. [facility] Pulmonary/Critical Care Medicine. Pager: [phone]. After hours pager: [phone].

## 2013-02-19 NOTE — Procedures (Signed)
US guided diagnostic/therapeutic right thoracentesis performed yielding 1.3 liters yellow fluid. The  fluid was sent to the lab for preordered studies. No immediate complications. F/u CXR pending.

## 2013-02-19 NOTE — Procedures (Signed)
Bronchoscopy  for Percutaneous  Tracheostomy  Name: Bayle Calvo MRN: [id number] DOB: 1934/05/30 Procedure: Bronchoscopy for Percutaneous Tracheostomy Indications: Diagnostic evaluation of the airways In conjunction with: Dr. Molli Knock  Procedure Details Consent: Risks of procedure as well as the alternatives and risks of each were explained to the (patient/caregiver).  Consent for procedure obtained. Time Out: Verified patient identification, verified procedure, site/side was marked, verified correct patient position, special equipment/implants available, medications/allergies/relevent history reviewed, required imaging and test results available.  Performed  In preparation for procedure, patient was given 100% FiO2 and bronchoscope lubricated. Sedation: Benzodiazepines, Muscle relaxants and Etomidate  Airway entered and the following bronchi were examined: Trachea, R mainstem and L main bronchi   Procedures performed: Endotracheal Tube retracted in 2 cm increments. Cannulation of airway observed. Dilation observed. Placement of trachel tube  observed . No overt complications. Bronchoscope removed.  Patient placed back on 100% FiO2 at conclusion of procedure.    Evaluation Hemodynamic Status: Transient hypotension treated with fluid; O2 sats: stable throughout Patient's Current Condition: stable Specimens:  None Complications: No apparent complications Patient did tolerate procedure well.   Brett Canales Minor ACNP [facility] PCCM Pager [phone] till 3 pm If no answer page [phone] 02/19/2013, 2:40 PM   I was present for and supervised the entire procedure  Billy Fischer, MD ; [facility] service Mobile [phone].  After 5:30 PM or weekends, call [phone]

## 2013-02-20 LAB — BASIC METABOLIC PANEL
CO2: 23 mEq/L (ref 19–32)
Calcium: 8.2 mg/dL — ABNORMAL LOW (ref 8.4–10.5)
Chloride: 109 mEq/L (ref 96–112)
GFR calc Af Amer: 70 mL/min — ABNORMAL LOW (ref >90)
Glucose, Bld: 113 mg/dL — ABNORMAL HIGH (ref 70–99)
Potassium: 4.2 mEq/L (ref 3.5–5.1)
Sodium: 143 mEq/L (ref 135–145)

## 2013-02-20 LAB — CBC
Hemoglobin: 8.1 g/dL — ABNORMAL LOW (ref 12.0–15.0)
MCH: 30.8 pg (ref 26.0–34.0)
Platelets: 456 10*3/uL — ABNORMAL HIGH (ref 150–400)
RBC: 2.63 MIL/uL — ABNORMAL LOW (ref 3.87–5.11)
WBC: 12.4 10*3/uL — ABNORMAL HIGH (ref 4.0–10.5)

## 2013-02-21 ENCOUNTER — Other Ambulatory Visit (HOSPITAL_COMMUNITY): Payer: Medicare Other

## 2013-02-21 LAB — CULTURE, RESPIRATORY W GRAM STAIN

## 2013-02-22 ENCOUNTER — Other Ambulatory Visit (HOSPITAL_COMMUNITY): Payer: Medicare Other

## 2013-02-22 LAB — PATHOLOGIST SMEAR REVIEW: Path Review: REACTIVE

## 2013-02-22 NOTE — Progress Notes (Signed)
PULMONARY  / CRITICAL CARE MEDICINE  Name: Michelle Cardenas MRN: [id number] DOB: Nov 19, 1934    ADMISSION DATE:  02/12/13 CONSULTATION DATE:  11/19  REFERRING MD :  11/19 PRIMARY SERVICE:  [facility]   CHIEF COMPLAINT:   Respiratory failure   BRIEF PATIENT DESCRIPTION:  78 year old female w/ h/o CP, dysphagia and MR. Resides at [facility]. Admitted to outside hospital for acute respiratory failure and septic shock in setting of PCM, aspiration PNA, staph epidermis bacteremia (was likely contaminant). She was treated w/ broad spec abx, fluid resuscitation, and mechanical ventilation. Extubated at outside hospital. Transferred to [facility] on 11/18 for continued abx and persistent high FIO2 needs. PCCM asked to see on 11/19 for persistent respiratory failure   SIGNIFICANT EVENTS / STUDIES:  11/25 - CT abd>> Small rim enhancing fluid collections in right iliacus muscle measuring up to 3.6 cm, consistent with small abscesses.Mild ascites and diffuse body wall edema. Small bilateral pleural effusions and bibasilar atelectasis also noted. 11/26 - CT guided needle asp >> small amt blood c/w hematoma   LINES / TUBES: Left [place] CVL>> oett 11/21>>>  CULTURES: Outside facility Blood cultures: staph epidermis Sputum: e coli and proteus C diff positive  Abscess 11/26: neg  ANTIBIOTICS: clinda 11/17>>>off Oral vanc 11/17>>> zyvox and cefepime 8 days outside facility Oral vanc X 12 d prior to admit deficid 10 d prior to admit   SUBJECTIVE: Weaning on ATC  VITAL SIGNS: Vital signs reviewed at bedside.  PHYSICAL EXAMINATION: General:  Chronically ill appearing white female, NAD on ATC Neuro:  MAE x 4, int agitated, tracks HEENT:  No JVD Cardiovascular:  Reg irreg  Lungs:  Resps even non labored, Scattered coarse rhonchi/ decreased bilat bases, copious oral secretions Abdomen:  Distended, firm to palp + bowel sounds  Musculoskeletal:  Intact Skin:  Diffuse anasarca    Recent Labs Lab 02/17/13 0615  02/19/13 0715 02/20/13 0430  NA 144 141 143  K 4.1 4.4 4.2  CL 108 107 109  CO2 26 25 23   BUN 36* 36* 37*  CREATININE 0.78 0.78 0.90  GLUCOSE 144* 131* 113*    Recent Labs Lab 02/17/13 0615 02/19/13 1051 02/20/13 0430  HGB 8.1* 8.0* 8.1*  HCT 23.7* 25.1* 24.6*  WBC 10.7* 12.9* 12.4*  PLT 456* 407* 456*   ABG    Component Value Date/Time   PHART 7.486* 02/08/2013 1245   PCO2ART 35.4 02/08/2013 1245   PO2ART 96.1 02/08/2013 1245   HCO3 26.4* 02/08/2013 1245   TCO2 27.5 02/08/2013 1245   ACIDBASEDEF 0.7 02/03/2013 1620   O2SAT 97.9 02/08/2013 1245     Dg Abd Portable 1v  02/22/2013   CLINICAL DATA:  Abdominal distention, ileus  EXAM: PORTABLE ABDOMEN - 1 VIEW  COMPARISON:  Portable exam 0557 hr compared to 02/21/2013  FINDINGS: Air-filled loops of large and small bowel throughout abdomen question ileus.  Gastrostomy tube projects over left upper quadrant.  Atelectasis versus consolidation left lower lobe.  Bones demineralized.  Stippled densities in pelvis question retained GI contrast.  No definite urinary tract calcification or bowel wall thickening.  IMPRESSION: Persistent air-filled loops of large and small bowel throughout abdomen question ileus.  Atelectasis versus consolidation left lower lobe.   Electronically Signed   By: Ulyses Southward M.D.   On: 02/22/2013 07:18   Dg Abd Portable 1v  02/21/2013   CLINICAL DATA:  Ileus.  EXAM: PORTABLE ABDOMEN - 1 VIEW  COMPARISON:  Chest x-ray on 02/19/2013, abdominal x-ray on 02/13/2013  FINDINGS: There is dense opacity at the left lung base obscuring hemidiaphragm. There is gaseous distention of multiple bowel loops. The stomach appears distended and contains a gastrostomy tube. No evidence for free intraperitoneal air.  IMPRESSION: Gaseous distention of numerous bowel loops, consistent with ileus.   Electronically Signed   By: Rosalie Gums M.D.   On: 02/21/2013 22:36   ASSESSMENT / PLAN:  Acute respiratory failure - Multifactorial:  persistent aspiration pna, prob element of pulmonary edema, bilateral effusions,  Recurrent mucous plugging and ATX Volume overload   Plan: - Cont vent wean protocol, ATC goal 2-4 hours today, I would follow slower wean protocol, given her current clinical status - Cont diuresis with K repletion - Emipric vanc/merrem per primary  - trend CXR - pulmonary hygiene as able, mobilize / upright positioning as able   All other medical issues per [facility].  SIRS / Sepsis Anasarca Hypokalemia Dysphagia Abdominal Abscesses - s/p IR Perc Drain 11/26 Anemia Hx of CP / MR  Canary Brim, NP-C [place] Pulmonary & Critical Care Pgr: [phone] or [phone]   I have fully examined this patient and agree with above findings.    And edited in full  Mcarthur Rossetti. Tyson Alias, MD, FACP Pgr: [phone]  Pulmonary & Critical Care'

## 2013-02-23 LAB — MAGNESIUM: Magnesium: 2 mg/dL (ref 1.5–2.5)

## 2013-02-23 LAB — CBC WITH DIFFERENTIAL/PLATELET
Basophils Absolute: 0 10*3/uL (ref 0.0–0.1)
Basophils Relative: 0 % (ref 0–1)
HCT: 21.1 % — ABNORMAL LOW (ref 36.0–46.0)
Hemoglobin: 6.9 g/dL — CL (ref 12.0–15.0)
Lymphocytes Relative: 9 % — ABNORMAL LOW (ref 12–46)
Lymphs Abs: 0.8 10*3/uL (ref 0.7–4.0)
MCHC: 32.7 g/dL (ref 30.0–36.0)
MCV: 91.3 fL (ref 78.0–100.0)
Monocytes Absolute: 0.8 10*3/uL (ref 0.1–1.0)
Monocytes Relative: 9 % (ref 3–12)
Neutro Abs: 7 10*3/uL (ref 1.7–7.7)
Neutrophils Relative %: 79 % — ABNORMAL HIGH (ref 43–77)
RDW: 16.6 % — ABNORMAL HIGH (ref 11.5–15.5)
WBC: 8.9 10*3/uL (ref 4.0–10.5)

## 2013-02-23 LAB — PREPARE RBC (CROSSMATCH)

## 2013-02-23 LAB — BASIC METABOLIC PANEL
CO2: 23 mEq/L (ref 19–32)
Calcium: 7.5 mg/dL — ABNORMAL LOW (ref 8.4–10.5)
Glucose, Bld: 102 mg/dL — ABNORMAL HIGH (ref 70–99)
Potassium: 3.4 mEq/L — ABNORMAL LOW (ref 3.5–5.1)
Sodium: 142 mEq/L (ref 135–145)

## 2013-02-23 LAB — BODY FLUID CULTURE: Gram Stain: NONE SEEN

## 2013-02-23 LAB — C-REACTIVE PROTEIN: CRP: 4.7 mg/dL — ABNORMAL HIGH (ref <0.60)

## 2013-02-24 LAB — TYPE AND SCREEN: Antibody Screen: NEGATIVE

## 2013-02-24 LAB — BASIC METABOLIC PANEL
BUN: 18 mg/dL (ref 6–23)
CO2: 22 mEq/L (ref 19–32)
Calcium: 7.6 mg/dL — ABNORMAL LOW (ref 8.4–10.5)
Chloride: 110 mEq/L (ref 96–112)
Creatinine, Ser: 0.81 mg/dL (ref 0.50–1.10)
GFR calc non Af Amer: 69 mL/min — ABNORMAL LOW (ref >90)

## 2013-02-24 LAB — CBC
HCT: 24.7 % — ABNORMAL LOW (ref 36.0–46.0)
MCH: 30 pg (ref 26.0–34.0)
MCV: 91.5 fL (ref 78.0–100.0)
Platelets: 375 10*3/uL (ref 150–400)
RDW: 16.8 % — ABNORMAL HIGH (ref 11.5–15.5)
WBC: 11.1 10*3/uL — ABNORMAL HIGH (ref 4.0–10.5)

## 2013-02-24 LAB — MISCELLANEOUS TEST

## 2013-02-24 LAB — CHOLESTEROL, BODY FLUID

## 2013-02-26 ENCOUNTER — Other Ambulatory Visit (HOSPITAL_COMMUNITY): Payer: Medicare Other

## 2013-02-26 LAB — COMPREHENSIVE METABOLIC PANEL
ALT: 9 U/L (ref 0–35)
Alkaline Phosphatase: 46 U/L (ref 39–117)
CO2: 21 mEq/L (ref 19–32)
Chloride: 105 mEq/L (ref 96–112)
GFR calc Af Amer: 80 mL/min — ABNORMAL LOW (ref >90)
GFR calc non Af Amer: 69 mL/min — ABNORMAL LOW (ref >90)
Glucose, Bld: 153 mg/dL — ABNORMAL HIGH (ref 70–99)
Potassium: 3.7 mEq/L (ref 3.5–5.1)
Sodium: 139 mEq/L (ref 135–145)

## 2013-02-26 LAB — GRAM STAIN

## 2013-02-26 LAB — CBC
MCHC: 33 g/dL (ref 30.0–36.0)
MCV: 90.2 fL (ref 78.0–100.0)
RDW: 15.9 % — ABNORMAL HIGH (ref 11.5–15.5)
WBC: 16.2 10*3/uL — ABNORMAL HIGH (ref 4.0–10.5)

## 2013-02-28 ENCOUNTER — Other Ambulatory Visit (HOSPITAL_COMMUNITY): Payer: Medicare Other

## 2013-02-28 LAB — CBC
HCT: 23.8 % — ABNORMAL LOW (ref 36.0–46.0)
MCHC: 34 g/dL (ref 30.0–36.0)
Platelets: 269 10*3/uL (ref 150–400)
RBC: 2.67 MIL/uL — ABNORMAL LOW (ref 3.87–5.11)
RDW: 15.7 % — ABNORMAL HIGH (ref 11.5–15.5)
WBC: 11.4 10*3/uL — ABNORMAL HIGH (ref 4.0–10.5)

## 2013-02-28 LAB — BASIC METABOLIC PANEL
Calcium: 6.8 mg/dL — ABNORMAL LOW (ref 8.4–10.5)
Chloride: 107 mEq/L (ref 96–112)
Creatinine, Ser: <0.2 mg/dL — ABNORMAL LOW (ref 0.50–1.10)

## 2013-02-28 LAB — URINE CULTURE

## 2013-03-01 ENCOUNTER — Other Ambulatory Visit (HOSPITAL_COMMUNITY): Payer: Medicare Other

## 2013-03-01 DIAGNOSIS — B49 Unspecified mycosis: Secondary | ICD-10-CM

## 2013-03-01 LAB — COMPREHENSIVE METABOLIC PANEL
ALT: 9 U/L (ref 0–35)
AST: 11 U/L (ref 0–37)
Albumin: 2.1 g/dL — ABNORMAL LOW (ref 3.5–5.2)
Alkaline Phosphatase: 37 U/L — ABNORMAL LOW (ref 39–117)
BUN: 43 mg/dL — ABNORMAL HIGH (ref 6–23)
CO2: 17 mEq/L — ABNORMAL LOW (ref 19–32)
Calcium: 8.1 mg/dL — ABNORMAL LOW (ref 8.4–10.5)
Creatinine, Ser: 0.84 mg/dL (ref 0.50–1.10)
GFR calc non Af Amer: 66 mL/min — ABNORMAL LOW (ref >90)
Sodium: 137 mEq/L (ref 135–145)

## 2013-03-01 LAB — DIFFERENTIAL
Basophils Absolute: 0 10*3/uL (ref 0.0–0.1)
Eosinophils Relative: 0 % (ref 0–5)
Lymphocytes Relative: 3 % — ABNORMAL LOW (ref 12–46)
Lymphs Abs: 0.4 10*3/uL — ABNORMAL LOW (ref 0.7–4.0)
Monocytes Absolute: 0.4 10*3/uL (ref 0.1–1.0)
Monocytes Relative: 3 % (ref 3–12)
Neutro Abs: 12.1 10*3/uL — ABNORMAL HIGH (ref 1.7–7.7)

## 2013-03-01 LAB — TRIGLYCERIDES: Triglycerides: 188 mg/dL — ABNORMAL HIGH (ref <150)

## 2013-03-01 LAB — CBC
HCT: 25.3 % — ABNORMAL LOW (ref 36.0–46.0)
Hemoglobin: 8.2 g/dL — ABNORMAL LOW (ref 12.0–15.0)
MCV: 90.4 fL (ref 78.0–100.0)
RBC: 2.8 MIL/uL — ABNORMAL LOW (ref 3.87–5.11)
WBC: 12.8 10*3/uL — ABNORMAL HIGH (ref 4.0–10.5)

## 2013-03-01 LAB — PHOSPHORUS: Phosphorus: 3.4 mg/dL (ref 2.3–4.6)

## 2013-03-01 NOTE — Progress Notes (Signed)
PULMONARY  / CRITICAL CARE MEDICINE  Name: Michelle Cardenas MRN: [id number] DOB: [birth year]    ADMISSION DATE:  02-03-13 CONSULTATION DATE:  11/19  REFERRING MD :  11/19 PRIMARY SERVICE:  [facility]   CHIEF COMPLAINT:   Respiratory failure   BRIEF PATIENT DESCRIPTION:  78 year old female w/ h/o CP, dysphagia and MR. Resides at [facility]. Admitted to outside hospital for acute respiratory failure and septic shock in setting of PCM, aspiration PNA, staph epidermis bacteremia (was likely contaminant). She was treated w/ broad spec abx, fluid resuscitation, and mechanical ventilation. Extubated at outside hospital. Transferred to [facility] on 11/18 for continued abx and persistent high FIO2 needs. PCCM asked to see on 11/19 for persistent respiratory failure   SIGNIFICANT EVENTS / STUDIES:  11/25 - CT abd>> Small rim enhancing fluid collections in right iliacus muscle measuring up to 3.6 cm, consistent with small abscesses.Mild ascites and diffuse body wall edema. Small bilateral pleural effusions and bibasilar atelectasis also noted. 11/26 - CT guided needle asp >> small amt blood c/w hematoma   LINES / TUBES: Left [place] CVL>> oett 11/21>>>12/5 Trach 12/5 >>  CULTURES: Outside facility Blood cultures: staph epidermis Sputum: e coli and proteus C diff positive  Abscess 11/26: neg ............................................. BCx2 12/12>>>yeast UC 12/12>>>proteus Mirabilis>>>sens rocephin BCx2 12/15>>>  ANTIBIOTICS: clinda 11/17>>>off Oral vanc 11/17>>> zyvox and cefepime 8 days outside facility Oral vanc X 12 d prior to admit deficid 10 d prior to admit  ......................................................... 12/14 Rocephin>>> 12/08 Vanco>>> 12/15 fluconazole >> 12/15 caspo >>  SUBJECTIVE: Weaning on PSV 12/5, 40%  VITAL SIGNS: Vital signs reviewed at bedside.  PHYSICAL EXAMINATION: General:  Chronically ill appearing white female, NAD.  Up to chair Neuro:  Awake, alert, follows  commands HEENT:  No JVD Cardiovascular:  Reg irreg  Lungs:  Resps even non labored, lungs bilaterally coarse, diminished bases, oral secretions Abdomen:  Round/soft, bsx4 active Musculoskeletal:  Intact Skin:  Diffuse anasarca    Recent Labs Lab 02/26/13 0755 02/28/13 0740 03/01/13 0652  NA 139 138 137  K 3.7 3.6 3.8  CL 105 107 105  CO2 21 14* 17*  BUN 19 26* 43*  CREATININE 0.81 <0.20* 0.84  GLUCOSE 153* 178* 365*    Recent Labs Lab 02/26/13 0755 02/28/13 0941 03/01/13 0652  HGB 9.7* 8.1* 8.2*  HCT 29.4* 23.8* 25.3*  WBC 16.2* 11.4* 12.8*  PLT 385 269 260   ABG    Component Value Date/Time   PHART 7.486* 02/08/2013 1245   PCO2ART 35.4 02/08/2013 1245   PO2ART 96.1 02/08/2013 1245   HCO3 26.4* 02/08/2013 1245   TCO2 27.5 02/08/2013 1245   ACIDBASEDEF 0.7 02/03/2013 1620   O2SAT 97.9 02/08/2013 1245     Dg Chest Port 1 View  03/01/2013   CLINICAL DATA:  Respiratory failure.  EXAM: PORTABLE CHEST - 1 VIEW  COMPARISON:  One-view chest 02/28/2013.  FINDINGS: The tracheostomy tube is in place. The heart is enlarged. A right-sided PICC line is stable in position. Bilateral interstitial and airspace disease is similar to the prior exam. Aeration at the right base is slightly improved. Bilateral pleural effusions persist, left greater than right.  IMPRESSION: 1. Persistent diffuse interstitial and airspace disease. This likely represents a combination of edema and atelectasis. Infection is not excluded. 2. Slight improved aeration at the right lung base. 3. Persistent bilateral pleural effusions, left greater than right.   Electronically Signed   By: Gennette Pac M.D.   On: 03/01/2013 08:11   Dg  Chest Port 1 View  02/28/2013   CLINICAL DATA:  Shortness of breath, chest pain  EXAM: PORTABLE CHEST - 1 VIEW  COMPARISON:  02/26/2013  FINDINGS: The cardiac silhouette is enlarged. Tracheostomy is appreciated with tip the level of clavicles. A right-sided PICC line is  appreciated with tip projecting regions superior vena caval right atrial junction. The left-sided central venous catheter has been removed in the interim. Diffuse bilateral pulmonary opacities are appreciated stable when compared to previous study. Focal areas of increased density projects within the lung bases. The osseous structures unremarkable.  IMPRESSION: Stable bilateral pulmonary opacities greatest confluence in the lung bases. Support lines and tubes described above.   Electronically Signed   By: Salome Holmes M.D.   On: 02/28/2013 13:40   ASSESSMENT / PLAN:  Acute respiratory failure - Multifactorial: persistent aspiration pna, prob element of pulmonary edema, bilateral effusions,  Recurrent mucous plugging and ATX Volume overload   Plan: - Cont vent wean protocol - PSV wean, allow for slower wean protocol - Cont diuresis with K repletion - ABX per primary  - trend CXR - pulmonary hygiene as able, mobilize / upright positioning as able -current weaning failure may be related to sepsis  Yeast sepsis - would add caspofungin & dc diflucan Try enteral feeding rather than TNA ID consult  All other medical issues per [facility].  SIRS / Sepsis Anasarca Hypokalemia Dysphagia Abdominal Abscesses - s/p IR Perc Drain 11/26 Anemia Hx of CP / MR  Canary Brim, NP-C [place] Pulmonary & Critical Care Pgr: [phone] or [phone]  Discussed on multidisciplinary rounds with select team  Cyril Mourning MD. [facility]. [place] Pulmonary & Critical care Pager [phone] If no response call [phone]

## 2013-03-02 LAB — CBC
HCT: 28.3 % — ABNORMAL LOW (ref 36.0–46.0)
Hemoglobin: 9.4 g/dL — ABNORMAL LOW (ref 12.0–15.0)
MCHC: 33.2 g/dL (ref 30.0–36.0)
MCV: 90.1 fL (ref 78.0–100.0)
RDW: 15.7 % — ABNORMAL HIGH (ref 11.5–15.5)

## 2013-03-02 LAB — DIFFERENTIAL
Basophils Absolute: 0 10*3/uL (ref 0.0–0.1)
Basophils Relative: 0 % (ref 0–1)
Eosinophils Absolute: 0 10*3/uL (ref 0.0–0.7)
Eosinophils Relative: 0 % (ref 0–5)
Lymphocytes Relative: 1 % — ABNORMAL LOW (ref 12–46)
Monocytes Absolute: 0.5 10*3/uL (ref 0.1–1.0)
Monocytes Relative: 2 % — ABNORMAL LOW (ref 3–12)
Neutro Abs: 22.7 10*3/uL — ABNORMAL HIGH (ref 1.7–7.7)

## 2013-03-02 LAB — CULTURE, BLOOD (ROUTINE X 2)

## 2013-03-02 LAB — COMPREHENSIVE METABOLIC PANEL
ALT: 11 U/L (ref 0–35)
AST: 13 U/L (ref 0–37)
Alkaline Phosphatase: 52 U/L (ref 39–117)
BUN: 53 mg/dL — ABNORMAL HIGH (ref 6–23)
CO2: 15 mEq/L — ABNORMAL LOW (ref 19–32)
Chloride: 102 mEq/L (ref 96–112)
GFR calc Af Amer: 74 mL/min — ABNORMAL LOW (ref >90)
Glucose, Bld: 518 mg/dL — ABNORMAL HIGH (ref 70–99)
Potassium: 4.1 mEq/L (ref 3.5–5.1)
Sodium: 135 mEq/L (ref 135–145)
Total Bilirubin: 0.2 mg/dL — ABNORMAL LOW (ref 0.3–1.2)
Total Protein: 6.4 g/dL (ref 6.0–8.3)

## 2013-03-02 LAB — TRIGLYCERIDES: Triglycerides: 233 mg/dL — ABNORMAL HIGH (ref <150)

## 2013-03-03 LAB — CBC
MCH: 29.7 pg (ref 26.0–34.0)
MCHC: 33.1 g/dL (ref 30.0–36.0)
MCV: 89.8 fL (ref 78.0–100.0)
Platelets: 261 10*3/uL (ref 150–400)
RDW: 15.6 % — ABNORMAL HIGH (ref 11.5–15.5)
WBC: 12.4 10*3/uL — ABNORMAL HIGH (ref 4.0–10.5)

## 2013-03-03 NOTE — Progress Notes (Signed)
PULMONARY  / CRITICAL CARE MEDICINE  Name: Michelle Cardenas MRN: [id number] DOB: 1934-09-17    ADMISSION DATE:  01/31/2013 CONSULTATION DATE:  11/19  REFERRING MD :  11/19 PRIMARY SERVICE:  [facility]   CHIEF COMPLAINT:   Respiratory failure   BRIEF PATIENT DESCRIPTION:  78 year old female with PMH of CP, dysphagia and MR. Resides at [facility]. Admitted to outside hospital for acute respiratory failure and septic shock in setting of PCM, aspiration PNA, staph epidermis bacteremia (was likely contaminant). She was treated w/ broad spec abx, fluid resuscitation, and mechanical ventilation. Extubated at outside hospital. Transferred to [facility] on 11/18 for continued abx and persistent high FIO2 needs. PCCM asked to see on 11/19 for persistent respiratory failure   SIGNIFICANT EVENTS / STUDIES:  11/25 - CT abd>> Small rim enhancing fluid collections in right iliacus muscle measuring up to 3.6 cm, consistent with small abscesses.Mild ascites and diffuse body wall edema. Small bilateral pleural effusions and bibasilar atelectasis also noted. 11/26 - CT guided needle asp >> small amt blood c/w hematoma  12/17 - failed weaning with increased RR  LINES / TUBES: Left [place] CVL>> oett 11/21>>>12/5 Trach 12/5 >>  CULTURES: Outside facility Blood cultures: staph epidermis Sputum: e coli and proteus C diff positive  Abscess 11/26: neg ............................................. BCx2 12/12>>>candida parapsilosis UC 12/12>>>proteus Mirabilis>>>sens rocephin BCx2 12/15>>> Cath Tip 12/16>>>  ANTIBIOTICS: clinda 11/17>>>off Oral vanc 11/17>>> zyvox and cefepime 8 days outside facility Oral vanc X 12 d prior to admit deficid 10 d prior to admit  ......................................................... 12/14 Rocephin>>> 12/08 Vanco>>> 12/15 fluconazole >> 12/15 caspo >>  SUBJECTIVE: RT reports pt failed wean this am - increased RR & WOB oob to chair  VITAL SIGNS: Vital signs reviewed at  bedside.  PHYSICAL EXAMINATION: General:  Chronically ill appearing white female, NAD.  Up to chair Neuro:  Awake, alert, follows commands HEENT:  No JVD Cardiovascular:  Reg irreg  Lungs:  Resps even non labored, lungs bilaterally coarse, diminished bases, oral secretions Abdomen:  Round/soft, bsx4 active Musculoskeletal:  Intact Skin:  Diffuse anasarca    Recent Labs Lab 02/28/13 0740 03/01/13 0652 03/02/13 0615  NA 138 137 135  K 3.6 3.8 4.1  CL 107 105 102  CO2 14* 17* 15*  BUN 26* 43* 53*  CREATININE <0.20* 0.84 0.86  GLUCOSE 178* 365* 518*    Recent Labs Lab 03/01/13 0652 03/02/13 0615 03/03/13 0050  HGB 8.2* 9.4* 7.9*  HCT 25.3* 28.3* 23.9*  WBC 12.8* 23.4* 12.4*  PLT 260 491* 261   ABG    Component Value Date/Time   PHART 7.486* 02/08/2013 1245   PCO2ART 35.4 02/08/2013 1245   PO2ART 96.1 02/08/2013 1245   HCO3 26.4* 02/08/2013 1245   TCO2 27.5 02/08/2013 1245   ACIDBASEDEF 0.7 02/03/2013 1620   O2SAT 97.9 02/08/2013 1245     No results found. ASSESSMENT / PLAN:  Acute respiratory failure - Multifactorial: persistent aspiration pna, prob element of pulmonary edema, bilateral effusions, mucus plugging & ATX Recurrent mucous plugging and ATX Volume overload   Plan: - Cont vent wean protocol - PSV wean, allow for slower wean protocol - Cont diuresis with K repletion - ABX per primary  - trend CXR - pulmonary hygiene as able, mobilize / upright positioning as able -current weaning failure may be related to sepsis/ deconditioning, edema  Yeast sepsis -non albicans candida  Plan: - continue caspofungin , defer to ID  - Rec d/c TNA & re- attempt trickle  enteral feeding  -  ID consult  All other medical issues per [facility].  SIRS / Sepsis Anasarca Hypokalemia Dysphagia Abdominal Abscesses - s/p IR Perc Drain 11/26 Anemia Hx of CP / MR  Canary Brim, NP-C  Pulmonary & Critical Care Pgr: [phone] or [phone]  Discussed on  multidisciplinary rounds with [facility] team  [facility].

## 2013-03-04 LAB — CULTURE, BLOOD (ROUTINE X 2)

## 2013-03-04 LAB — COMPREHENSIVE METABOLIC PANEL
AST: 88 U/L — ABNORMAL HIGH (ref 0–37)
Albumin: 2.2 g/dL — ABNORMAL LOW (ref 3.5–5.2)
Calcium: 8.7 mg/dL (ref 8.4–10.5)
Creatinine, Ser: 0.89 mg/dL (ref 0.50–1.10)
GFR calc non Af Amer: 61 mL/min — ABNORMAL LOW (ref >90)
Total Protein: 5 g/dL — ABNORMAL LOW (ref 6.0–8.3)

## 2013-03-04 LAB — PHOSPHORUS: Phosphorus: 4 mg/dL (ref 2.3–4.6)

## 2013-03-05 ENCOUNTER — Other Ambulatory Visit (HOSPITAL_COMMUNITY): Payer: Medicare Other

## 2013-03-05 LAB — CBC
Hemoglobin: 9.2 g/dL — ABNORMAL LOW (ref 12.0–15.0)
MCV: 89.2 fL (ref 78.0–100.0)
Platelets: 331 10*3/uL (ref 150–400)
RBC: 3.14 MIL/uL — ABNORMAL LOW (ref 3.87–5.11)
WBC: 21.5 10*3/uL — ABNORMAL HIGH (ref 4.0–10.5)

## 2013-03-05 LAB — CATH TIP CULTURE: Culture: NO GROWTH

## 2013-03-05 NOTE — Progress Notes (Signed)
PULMONARY  / CRITICAL CARE MEDICINE  Name: Michelle Cardenas MRN: [id number] DOB: 07-25-1934    ADMISSION DATE:  02/10/2013 CONSULTATION DATE:  11/19  REFERRING MD :  11/19 PRIMARY SERVICE:  [facility]   CHIEF COMPLAINT:   Respiratory failure   BRIEF PATIENT DESCRIPTION:  78 year old female with PMH of CP, dysphagia and MR. Resides at [facility]. Admitted to outside hospital for acute respiratory failure and septic shock in setting of PCM, aspiration PNA, staph epidermis bacteremia (was likely contaminant). She was treated w/ broad spec abx, fluid resuscitation, and mechanical ventilation. Extubated at outside hospital. Transferred to [facility] on 11/18 for continued abx and persistent high FIO2 needs. PCCM asked to see on 11/19 for persistent respiratory failure   SIGNIFICANT EVENTS / STUDIES:  11/25 - CT abd>> Small rim enhancing fluid collections in right iliacus muscle measuring up to 3.6 cm, consistent with small abscesses.Mild ascites and diffuse body wall edema. Small bilateral pleural effusions and bibasilar atelectasis also noted. 11/26 - CT guided needle asp >> small amt blood c/w hematoma  12/17 - failed weaning with increased RR  LINES / TUBES: Left [place] CVL>> oett 11/21>>>12/5 Trach 12/5 >>  CULTURES: Outside facility Blood cultures: staph epidermis Sputum: e coli and proteus C diff positive  Abscess 11/26: neg ............................................. BCx2 12/12>>>candida parapsilosis UC 12/12>>>proteus Mirabilis>>>sens rocephin BCx2 12/16>>>ng Cath Tip 12/16>>>ng  ANTIBIOTICS: clinda 11/17>>>off Oral vanc 11/17>>> zyvox and cefepime 8 days outside facility Oral vanc X 12 d prior to admit deficid 10 d prior to admit  ......................................................... 12/14 Rocephin>>> 12/08 Vanco>>> 12/15 fluconazole >>12/15 12/15 caspo >>  SUBJECTIVE: failing wean attempts In bed, no obvious pain  VITAL SIGNS: Vital signs reviewed at bedside.  PHYSICAL  EXAMINATION: General:  Chronically ill appearing white female, NAD.   Neuro:  Awake, alert, follows commands HEENT:  No JVD Cardiovascular:  Reg irreg  Lungs:  Resps even non labored, lungs bilaterally coarse, diminished bases, oral secretions Abdomen:  Round/soft, bsx4 active Musculoskeletal:  Intact Skin:  Diffuse anasarca    Recent Labs Lab 03/01/13 0652 03/02/13 0615 03/04/13 0520  NA 137 135 136  K 3.8 4.1 4.0  CL 105 102 105  CO2 17* 15* 21  BUN 43* 53* 62*  CREATININE 0.84 0.86 0.89  GLUCOSE 365* 518* 208*    Recent Labs Lab 03/02/13 0615 03/03/13 0050 03/05/13 0725  HGB 9.4* 7.9* 9.2*  HCT 28.3* 23.9* 28.0*  WBC 23.4* 12.4* 21.5*  PLT 491* 261 331   ABG    Component Value Date/Time   PHART 7.486* 02/08/2013 1245   PCO2ART 35.4 02/08/2013 1245   PO2ART 96.1 02/08/2013 1245   HCO3 26.4* 02/08/2013 1245   TCO2 27.5 02/08/2013 1245   ACIDBASEDEF 0.7 02/03/2013 1620   O2SAT 97.9 02/08/2013 1245     Dg Chest Port 1 View  03/05/2013   CLINICAL DATA:  Respiratory failure.  EXAM: PORTABLE CHEST - 1 VIEW  COMPARISON:  March 01, 2013.  FINDINGS: The lungs are reasonably well inflated. The tracheostomy appliance tip lies along the level of the superior margin of the clavicles. The hemidiaphragms are obscured today. This is a significant change on the right but is unchanged on the left. The findings are consistent with bilateral pleural effusions layering posteriorly. The interstitial markings of both lungs remain increased. The cardiopericardial silhouette remains mildly enlarged and the pulmonary vascularity is engorged and indistinct. The PICC line on the right appears to have been withdrawn partially such that its tip lies in the  region of the junction of the right subclavian vein with a right internal jugular vein. Previously its tip and extended to the level of the midportion of the SVC.  IMPRESSION: 1. The appearance of the pulmonary interstitium and of the  pleural spaces has deteriorated with somewhat increased pulmonary interstitial edema and an increased volume of pleural fluid especially on the right. This may be of cardiac or noncardiac cause. Certainly pneumonia could be present and be responsible for these findings. 2. The PICC line appears to have been withdrawn by at least 10 cm. Correlation clinically as to the adequacy of positioning now is needed.   Electronically Signed   By: David  [place]   On: 03/05/2013 08:11   ASSESSMENT / PLAN:  Acute respiratory failure - Multifactorial: aspiration pna, prob element of pulmonary edema, bilateral effusions, fungemia Recurrent mucous plugging and ATX Volume overload   Plan: - Cont vent wean protocol with PSV  - Cont diuresis with K repletion - pulmonary hygiene as able, mobilize / upright positioning as able -current weaning failure may be related to sepsis/ deconditioning, edema  Yeast sepsis -non albicans candida  Plan: - continue caspofungin , defer to ID   Persistent ileus - Rec  re- attempt trickle  enteral feeding , ? repositio to j-tube, do not stop unless residuals > 500 -Trial of reglan 10 q 8h -keep K 4 & above  Ana sarca  -Lasix should be IV  All other medical issues per [facility].  SIRS / Sepsis Anasarca Hypokalemia Dysphagia Abdominal Abscesses - s/p IR Perc Drain 11/26 Anemia Hx of CP / MR  Discussed on multidisciplinary rounds with [facility] team  [facility]  [phone]

## 2013-03-07 LAB — CULTURE, BLOOD (ROUTINE X 2): Culture: NO GROWTH

## 2013-03-07 LAB — BASIC METABOLIC PANEL
BUN: 80 mg/dL — ABNORMAL HIGH (ref 6–23)
Chloride: 101 mEq/L (ref 96–112)
Creatinine, Ser: 1.06 mg/dL (ref 0.50–1.10)
GFR calc non Af Amer: 50 mL/min — ABNORMAL LOW (ref >90)
Glucose, Bld: 232 mg/dL — ABNORMAL HIGH (ref 70–99)
Potassium: 4.3 mEq/L (ref 3.5–5.1)
Sodium: 138 mEq/L (ref 135–145)

## 2013-03-07 LAB — CBC
HCT: 27.3 % — ABNORMAL LOW (ref 36.0–46.0)
Hemoglobin: 9 g/dL — ABNORMAL LOW (ref 12.0–15.0)
MCHC: 33 g/dL (ref 30.0–36.0)
MCV: 89.5 fL (ref 78.0–100.0)
WBC: 25 10*3/uL — ABNORMAL HIGH (ref 4.0–10.5)

## 2013-03-08 ENCOUNTER — Other Ambulatory Visit (HOSPITAL_COMMUNITY): Payer: Medicare Other

## 2013-03-08 LAB — BASIC METABOLIC PANEL
BUN: 87 mg/dL — ABNORMAL HIGH (ref 6–23)
Calcium: 8.3 mg/dL — ABNORMAL LOW (ref 8.4–10.5)
Chloride: 103 mEq/L (ref 96–112)
Creatinine, Ser: 1.09 mg/dL (ref 0.50–1.10)
GFR calc non Af Amer: 48 mL/min — ABNORMAL LOW (ref >90)
Glucose, Bld: 369 mg/dL — ABNORMAL HIGH (ref 70–99)

## 2013-03-08 LAB — CBC
HCT: 22.6 % — ABNORMAL LOW (ref 36.0–46.0)
Hemoglobin: 7.5 g/dL — ABNORMAL LOW (ref 12.0–15.0)
MCH: 30 pg (ref 26.0–34.0)
MCHC: 33.2 g/dL (ref 30.0–36.0)
MCV: 90.4 fL (ref 78.0–100.0)
Platelets: 257 10*3/uL (ref 150–400)
RDW: 16.9 % — ABNORMAL HIGH (ref 11.5–15.5)

## 2013-03-08 LAB — CULTURE, BLOOD (ROUTINE X 2)

## 2013-03-08 NOTE — Progress Notes (Signed)
PULMONARY  / CRITICAL CARE MEDICINE  Name: Michelle Cardenas MRN: [id number] DOB: [birth year]    ADMISSION DATE:  2013-02-26 CONSULTATION DATE:  11/19  REFERRING MD :  11/19 PRIMARY SERVICE:  [facility]   CHIEF COMPLAINT:   Respiratory failure   BRIEF PATIENT DESCRIPTION:  78 year old female with PMH of CP, dysphagia and MR. Resides at [facility]. Admitted to outside hospital for acute respiratory failure and septic shock in setting of PCM, aspiration PNA, staph epidermis bacteremia (was likely contaminant). She was treated w/ broad spec abx, fluid resuscitation, and mechanical ventilation. Extubated at outside hospital. Transferred to [facility] on 11/18 for continued abx and persistent high FIO2 needs. PCCM asked to see on 11/19 for persistent respiratory failure   SIGNIFICANT EVENTS / STUDIES:  11/25 - CT abd>> Small rim enhancing fluid collections in right iliacus muscle measuring up to 3.6 cm, consistent with small abscesses.Mild ascites and diffuse body wall edema. Small bilateral pleural effusions and bibasilar atelectasis also noted. 11/26 - CT guided needle asp >> small amt blood c/w hematoma  12/17 - failed weaning with increased RR  LINES / TUBES: Left [place] CVL>> oett 11/21>>>12/5 Trach 12/5 >>  CULTURES: Outside facility Blood cultures: staph epidermis Sputum: e coli and proteus C diff positive  Abscess 11/26: neg ............................................. BCx2 12/12>>>candida parapsilosis UC 12/12>>>proteus Mirabilis>>>sens rocephin BCx2 12/16>>>ng Cath Tip 12/16>>>ng  ANTIBIOTICS: clinda 11/17>>>off Oral vanc 11/17>>> zyvox and cefepime 8 days outside facility Oral vanc X 12 d prior to admit deficid 10 d prior to admit  ......................................................... 12/14 Rocephin>>> 12/08 Vanco>>> 12/15 fluconazole >>12/15 12/15 caspo >>  SUBJECTIVE: failing wean attempts In bed, no obvious pain  VITAL SIGNS: Vital signs reviewed at bedside.  PHYSICAL  EXAMINATION: General:  Chronically ill appearing white female, NAD.   Neuro:  Awake, alert, follows commands HEENT:  No JVD Cardiovascular:  Reg irreg  Lungs:  Resps even non labored, lungs bilaterally coarse, diminished bases, oral secretions Abdomen:  Round/soft, bsx4 active Musculoskeletal:  Intact Skin:  Diffuse anasarca    Recent Labs Lab 03/04/13 0520 03/07/13 0557 03/08/13 0930  NA 136 138 140  K 4.0 4.3 3.0*  CL 105 101 103  CO2 21 19 26   BUN 62* 80* 87*  CREATININE 0.89 1.06 1.09  GLUCOSE 208* 232* 369*    Recent Labs Lab 03/05/13 0725 03/07/13 0557 03/08/13 0930  HGB 9.2* 9.0* 7.5*  HCT 28.0* 27.3* 22.6*  WBC 21.5* 25.0* 20.0*  PLT 331 498* 257   ABG    Component Value Date/Time   PHART 7.486* 02/08/2013 1245   PCO2ART 35.4 02/08/2013 1245   PO2ART 96.1 02/08/2013 1245   HCO3 26.4* 02/08/2013 1245   TCO2 27.5 02/08/2013 1245   ACIDBASEDEF 0.7 02/03/2013 1620   O2SAT 97.9 02/08/2013 1245     Dg Chest Port 1 View  03/08/2013   CLINICAL DATA:  Respiratory failure .  EXAM: PORTABLE CHEST - 1 VIEW  COMPARISON:  03/05/2013.  FINDINGS: Tracheostomy tube in good anatomic position. PICC line in stable position. No cardiomegaly. Persistent and progressed bilateral pulmonary alveolar infiltrates are noted. Small pleural effusions cannot be excluded . No pneumothorax. No acute osseous abnormality.  IMPRESSION: 1. Stable line and tube positions. 2. Progressive bilateral pulmonary alveolar infiltrates. Small pleural effusions may be present bilaterally. These findings suggest the presence of congestive heart failure with pulmonary edema. Superimposed pneumonia cannot be excluded .   Electronically Signed   By: Maisie Fus  Register   On: 03/08/2013 08:21   ASSESSMENT /  PLAN:  Acute respiratory failure - Multifactorial: aspiration pna, prob element of pulmonary edema, bilateral effusions, fungemia Recurrent mucous plugging and ATX Volume overload Failure to  wean Tracheostomy status   Plan: - Cont vent wean protocol with PSV  - Cont diuresis with K repletion - pulmonary hygiene as able, mobilize / upright positioning as able -Not weanable due to her progressed deconditioning. Needs vent/snf  Yeast sepsis -non albicans candida  Plan: - continue caspofungin , defer to ID   Persistent ileus - Rec  re- attempt trickle  enteral feeding , ? repositio to j-tube, do not stop unless residuals > 500 -Trial of reglan 10 q 8h -keep K 4 & above  Ana sarca  -Lasix should be IV  All other medical issues per [facility].  SIRS / Sepsis Anasarca Hypokalemia Dysphagia Abdominal Abscesses - s/p IR Perc Drain 11/26 Anemia Hx of CP / MR  Discussed on multidisciplinary rounds with [facility] team  This is all medically ineffective therapy and futile, consider comfort care  BABCOCK,PETE  [phone]  I have fully examined this patient and agree with above findings.     Mcarthur Rossetti. Tyson Alias, MD, FACP Pgr: [phone] [place] [facility]

## 2013-03-09 LAB — POTASSIUM: Potassium: 3.9 mEq/L (ref 3.5–5.1)

## 2013-03-10 LAB — BUN: BUN: 84 mg/dL — ABNORMAL HIGH (ref 6–23)

## 2013-03-10 LAB — CREATININE, SERUM
Creatinine, Ser: 0.87 mg/dL (ref 0.50–1.10)
GFR calc Af Amer: 73 mL/min — ABNORMAL LOW (ref >90)

## 2013-03-11 LAB — BASIC METABOLIC PANEL
BUN: 86 mg/dL — ABNORMAL HIGH (ref 6–23)
CO2: 31 mEq/L (ref 19–32)
Calcium: 8.3 mg/dL — ABNORMAL LOW (ref 8.4–10.5)
Chloride: 103 mEq/L (ref 96–112)
Potassium: 3.2 mEq/L — ABNORMAL LOW (ref 3.5–5.1)
Sodium: 146 mEq/L — ABNORMAL HIGH (ref 135–145)

## 2013-03-11 LAB — CBC
Hemoglobin: 8.5 g/dL — ABNORMAL LOW (ref 12.0–15.0)
MCH: 30.4 pg (ref 26.0–34.0)
MCV: 94.6 fL (ref 78.0–100.0)
Platelets: 305 10*3/uL (ref 150–400)
RBC: 2.8 MIL/uL — ABNORMAL LOW (ref 3.87–5.11)
WBC: 25.3 10*3/uL — ABNORMAL HIGH (ref 4.0–10.5)

## 2013-03-12 DIAGNOSIS — J9600 Acute respiratory failure, unspecified whether with hypoxia or hypercapnia: Secondary | ICD-10-CM

## 2013-03-12 LAB — BASIC METABOLIC PANEL
CO2: 33 mEq/L — ABNORMAL HIGH (ref 19–32)
Chloride: 104 mEq/L (ref 96–112)
Creatinine, Ser: 0.87 mg/dL (ref 0.50–1.10)

## 2013-03-12 LAB — HEPATIC FUNCTION PANEL
ALT: 82 U/L — ABNORMAL HIGH (ref 0–35)
AST: 66 U/L — ABNORMAL HIGH (ref 0–37)
Albumin: 2.4 g/dL — ABNORMAL LOW (ref 3.5–5.2)
Alkaline Phosphatase: 156 U/L — ABNORMAL HIGH (ref 39–117)
Indirect Bilirubin: 0.5 mg/dL (ref 0.3–0.9)
Total Protein: 5.7 g/dL — ABNORMAL LOW (ref 6.0–8.3)

## 2013-03-12 NOTE — Progress Notes (Signed)
PULMONARY  / CRITICAL CARE MEDICINE  Name: Michelle Cardenas MRN: [id number] DOB: [birth year]    ADMISSION DATE:  02/10/2013 CONSULTATION DATE:  11/19  REFERRING MD :  11/19 PRIMARY SERVICE:  [facility]   CHIEF COMPLAINT:   Respiratory failure   BRIEF PATIENT DESCRIPTION:  78 year old female with PMH of CP, dysphagia and MR. Resides at [facility]. Admitted to outside hospital for acute respiratory failure and septic shock in setting of PCM, aspiration PNA, staph epidermis bacteremia (was likely contaminant). She was treated w/ broad spec abx, fluid resuscitation, and mechanical ventilation. Extubated at outside hospital. Transferred to [facility] on 11/18 for continued abx and persistent high FIO2 needs. PCCM asked to see on 11/19 for persistent respiratory failure   SIGNIFICANT EVENTS / STUDIES:  11/25 - CT abd>> Small rim enhancing fluid collections in right iliacus muscle measuring up to 3.6 cm, consistent with small abscesses.Mild ascites and diffuse body wall edema. Small bilateral pleural effusions and bibasilar atelectasis also noted. 11/26 - CT guided needle asp >> small amt blood c/w hematoma  12/17 - failed weaning with increased RR  LINES / TUBES: Left [place] CVL>> oett 11/21>>>12/5 Trach 12/5 >>  CULTURES: Outside facility Blood cultures: staph epidermis Sputum: e coli and proteus C diff positive  Abscess 11/26: neg ............................................. BCx2 12/12>>>candida parapsilosis UC 12/12>>>proteus Mirabilis>>>sens rocephin BCx2 12/16>>>ng Cath Tip 12/16>>>ng  ANTIBIOTICS: clinda 11/17>>>off Oral vanc 11/17>>> zyvox and cefepime 8 days outside facility Oral vanc X 12 d prior to admit deficid 10 d prior to admit  ......................................................... 12/14 Rocephin>>> 12/08 Vanco>>> 12/15 fluconazole >>12/15 12/15 caspo >>  SUBJECTIVE:   Does not meet SBT criteira. On high fiop2 50% with peep 5.  In bed, no obvious pain.  Looks pitiful  VITAL  SIGNS:  T 97.8 P 102 RR 20 BP 139/80 Pulse ox 100%  PHYSICAL EXAMINATION: General:  Chronically ill appearing white female, NAD.   Neuro:  Awake, alert, follows commands HEENT:  No JVD Cardiovascular:  Reg irreg  Lungs:  Resps even non labored, lungs bilaterally coarse, diminished bases, oral secretions Abdomen:  Round/soft, bsx4 active Musculoskeletal:  Intact Skin:  Diffuse anasarca   PULMONARY No results found for this basename: PHART, PCO2, PCO2ART, PO2, PO2ART, HCO3, TCO2, O2SAT,  in the last 168 hours  CBC  Recent Labs Lab 03/07/13 0557 03/08/13 0930 03/11/13 0628  HGB 9.0* 7.5* 8.5*  HCT 27.3* 22.6* 26.5*  WBC 25.0* 20.0* 25.3*  PLT 498* 257 305    COAGULATION No results found for this basename: INR,  in the last 168 hours  CARDIAC  No results found for this basename: TROPONINI,  in the last 168 hours No results found for this basename: PROBNP,  in the last 168 hours   CHEMISTRY  Recent Labs Lab 03/07/13 0557 03/08/13 0930  03/10/13 0520 03/11/13 0628 03/12/13 0640  NA 138 140  --   --  146* 147*  K 4.3 3.0*  < >  --  3.2* 4.1  CL 101 103  --   --  103 104  CO2 19 26  --   --  31 33*  GLUCOSE 232* 369*  --   --  170* 174*  BUN 80* 87*  --  84* 86* 90*  CREATININE 1.06 1.09  --  0.87 0.88 0.87  CALCIUM 9.0 8.3*  --   --  8.3* 8.4  < > = values in this interval not displayed. CrCl is unknown because there is no height on file for  the current visit.   LIVER  Recent Labs Lab 03/12/13 0640  AST 66*  ALT 82*  ALKPHOS 156*  BILITOT 0.6  PROT 5.7*  ALBUMIN 2.4*     INFECTIOUS No results found for this basename: LATICACIDVEN, PROCALCITON,  in the last 168 hours   ENDOCRINE CBG (last 3)  No results found for this basename: GLUCAP,  in the last 72 hours       IMAGING x48h  No results found.    ASSESSMENT / PLAN:  Acute respiratory failure - Multifactorial: aspiration pna, prob element of pulmonary edema, bilateral effusions,  fungemia Recurrent mucous plugging and ATX Volume overload Failure to wean Tracheostomy status  03/12/13: needing high level 02 and does not meet sbt criterial   Plan: - Cont vent wean protocol with PSV  - Cont diuresis with K repletion - pulmonary hygiene as able, mobilize / upright positioning as able -Not weanable due to her progressed deconditioning. Needs vent/snf v comfortt  Yeast sepsis -non albicans candida  Plan: - continue caspofungin , defer to ID   Persistent ileus - per primary team  Ana sarca  -Lasix should be IV  All other medical issues per [facility].  SIRS / Sepsis Anasarca Hypokalemia Dysphagia Abdominal Abscesses - s/p IR Perc Drain 11/26 Anemia Hx of CP / MR    Dr. Kalman Shan, M.D., [facility].C.P Pulmonary and Critical Care Medicine Staff Physician [facility] [place] [facility] Pager: [phone], If no answer or between  15:00h - 7:00h: call [phone]  03/12/2013 1:51 PM

## 2013-03-13 LAB — BASIC METABOLIC PANEL
BUN: 85 mg/dL — ABNORMAL HIGH (ref 6–23)
CO2: 32 mEq/L (ref 19–32)
Calcium: 8.3 mg/dL — ABNORMAL LOW (ref 8.4–10.5)
Creatinine, Ser: 0.91 mg/dL (ref 0.50–1.10)
GFR calc Af Amer: 69 mL/min — ABNORMAL LOW (ref >90)
Glucose, Bld: 208 mg/dL — ABNORMAL HIGH (ref 70–99)

## 2013-03-14 ENCOUNTER — Other Ambulatory Visit (HOSPITAL_COMMUNITY): Payer: Medicare Other

## 2013-03-14 LAB — BASIC METABOLIC PANEL
Chloride: 109 mEq/L (ref 96–112)
GFR calc Af Amer: 69 mL/min — ABNORMAL LOW (ref >90)
GFR calc non Af Amer: 60 mL/min — ABNORMAL LOW (ref >90)
Glucose, Bld: 278 mg/dL — ABNORMAL HIGH (ref 70–99)
Potassium: 4.7 mEq/L (ref 3.5–5.1)
Sodium: 150 mEq/L — ABNORMAL HIGH (ref 135–145)

## 2013-03-14 LAB — CBC WITH DIFFERENTIAL/PLATELET
Basophils Absolute: 0 10*3/uL (ref 0.0–0.1)
Basophils Relative: 0 % (ref 0–1)
Eosinophils Absolute: 0 10*3/uL (ref 0.0–0.7)
HCT: 27.7 % — ABNORMAL LOW (ref 36.0–46.0)
Hemoglobin: 8.4 g/dL — ABNORMAL LOW (ref 12.0–15.0)
Lymphocytes Relative: 3 % — ABNORMAL LOW (ref 12–46)
MCH: 30.7 pg (ref 26.0–34.0)
MCHC: 30.3 g/dL (ref 30.0–36.0)
Monocytes Relative: 2 % — ABNORMAL LOW (ref 3–12)
Neutro Abs: 37.1 10*3/uL — ABNORMAL HIGH (ref 1.7–7.7)
Neutrophils Relative %: 95 % — ABNORMAL HIGH (ref 43–77)
Platelets: 237 10*3/uL (ref 150–400)
RDW: 21.6 % — ABNORMAL HIGH (ref 11.5–15.5)
WBC: 39 10*3/uL — ABNORMAL HIGH (ref 4.0–10.5)

## 2013-03-14 LAB — PROCALCITONIN: Procalcitonin: <0.1 ng/mL

## 2013-03-14 LAB — LACTIC ACID, PLASMA: Lactic Acid, Venous: 2.3 mmol/L — ABNORMAL HIGH (ref 0.5–2.2)

## 2013-03-15 DIAGNOSIS — J9600 Acute respiratory failure, unspecified whether with hypoxia or hypercapnia: Secondary | ICD-10-CM

## 2013-03-15 LAB — CBC
HCT: 23.1 % — ABNORMAL LOW (ref 36.0–46.0)
Hemoglobin: 7 g/dL — ABNORMAL LOW (ref 12.0–15.0)
MCV: 100.9 fL — ABNORMAL HIGH (ref 78.0–100.0)
RBC: 2.29 MIL/uL — ABNORMAL LOW (ref 3.87–5.11)
RDW: 21.5 % — ABNORMAL HIGH (ref 11.5–15.5)
WBC: 20 10*3/uL — ABNORMAL HIGH (ref 4.0–10.5)

## 2013-03-15 LAB — COMPREHENSIVE METABOLIC PANEL
ALT: 108 U/L — ABNORMAL HIGH (ref 0–35)
AST: 63 U/L — ABNORMAL HIGH (ref 0–37)
Albumin: 2.3 g/dL — ABNORMAL LOW (ref 3.5–5.2)
BUN: 87 mg/dL — ABNORMAL HIGH (ref 6–23)
Calcium: 8.2 mg/dL — ABNORMAL LOW (ref 8.4–10.5)
Sodium: 153 mEq/L — ABNORMAL HIGH (ref 135–145)
Total Protein: 5 g/dL — ABNORMAL LOW (ref 6.0–8.3)

## 2013-03-15 LAB — DIFFERENTIAL
Basophils Absolute: 0 10*3/uL (ref 0.0–0.1)
Basophils Relative: 0 % (ref 0–1)
Lymphocytes Relative: 3 % — ABNORMAL LOW (ref 12–46)
Lymphs Abs: 0.7 10*3/uL (ref 0.7–4.0)
Monocytes Absolute: 0.4 10*3/uL (ref 0.1–1.0)
Neutro Abs: 18.9 10*3/uL — ABNORMAL HIGH (ref 1.7–7.7)

## 2013-03-15 LAB — PHOSPHORUS: Phosphorus: 3.9 mg/dL (ref 2.3–4.6)

## 2013-03-15 NOTE — Progress Notes (Signed)
PULMONARY  / CRITICAL CARE MEDICINE  Name: Michelle Cardenas MRN: [id number] DOB: January 03, 1935    ADMISSION DATE:  2013-02-12 CONSULTATION DATE:  11/19  REFERRING MD :  11/19 PRIMARY SERVICE:  [facility]   CHIEF COMPLAINT:   Respiratory failure   BRIEF PATIENT DESCRIPTION:  78 year old female with PMH of CP, dysphagia and MR. Resides at [facility]. Admitted to outside hospital for acute respiratory failure and septic shock in setting of PCM, aspiration PNA, staph epidermis bacteremia (was likely contaminant). She was treated w/ broad spec abx, fluid resuscitation, and mechanical ventilation. Extubated at outside hospital. Transferred to [facility] on 11/18 for continued abx and persistent high FIO2 needs. PCCM asked to see on 11/19 for persistent respiratory failure   SIGNIFICANT EVENTS / STUDIES:  11/25 - CT abd>> Small rim enhancing fluid collections in right iliacus muscle measuring up to 3.6 cm, consistent with small abscesses.Mild ascites and diffuse body wall edema. Small bilateral pleural effusions and bibasilar atelectasis also noted. 11/26 - CT guided needle asp >> small amt blood c/w hematoma  12/17 - failed weaning with increased RR 12-29 [facility] exploring withdrawal of care with goal of comfort.  LINES / TUBES: Left [place] CVL>> oett 11/21>>>12/5 Trach 12/5 >>  CULTURES: Outside facility Blood cultures: staph epidermis Sputum: e coli and proteus C diff positive  Abscess 11/26: neg ............................................. BCx2 12/12>>>candida parapsilosis UC 12/12>>>proteus Mirabilis>>>sens rocephin BCx2 12/16>>>ng Cath Tip 12/16>>>ng  ANTIBIOTICS: clinda 11/17>>>off Oral vanc 11/17>>> zyvox and cefepime 8 days outside facility Oral vanc X 12 d prior to admit deficid 10 d prior to admit  ......................................................... 12/14 Rocephin>>> 12/08 Vanco>>> 12/15 fluconazole >>12/15 12/15 caspo >>  SUBJECTIVE:   Does not meet SBT criteira. On high fiop2 50% with  peep 5.  In bed, no obvious pain. Sedated on versed gtt   VITAL SIGNS:  Vital signs reviewed. Abnormal values will appear under impression plan section.    PHYSICAL EXAMINATION: General:  Chronically ill appearing white female, NAD.   Neuro:  Not following commands HEENT:  No JVD Cardiovascular:  Reg irreg  Lungs:  Resps even non labored, lungs bilaterally coarse, diminished bases, oral secretions Abdomen:  Round/soft, bsx4 active Musculoskeletal:  Intact Skin:  Diffuse anasarca   PULMONARY No results found for this basename: PHART, PCO2, PCO2ART, PO2, PO2ART, HCO3, TCO2, O2SAT,  in the last 168 hours  CBC  Recent Labs Lab 03/11/13 0628 03/14/13 1340 03/15/13 0740  HGB 8.5* 8.4* 7.0*  HCT 26.5* 27.7* 23.1*  WBC 25.3* 39.0* 20.0*  PLT 305 237 125*    COAGULATION No results found for this basename: INR,  in the last 168 hours  CARDIAC  No results found for this basename: TROPONINI,  in the last 168 hours No results found for this basename: PROBNP,  in the last 168 hours   CHEMISTRY  Recent Labs Lab 03/11/13 0628 03/12/13 0640 03/13/13 0640 03/14/13 1340 03/15/13 0740  NA 146* 147* 148* 150* 153*  K 3.2* 4.1 4.0 4.7 3.6  CL 103 104 106 109 112  CO2 31 33* 32 33* 31  GLUCOSE 170* 174* 208* 278* 271*  BUN 86* 90* 85* 87* 87*  CREATININE 0.88 0.87 0.91 0.91 0.86  CALCIUM 8.3* 8.4 8.3* 8.4 8.2*  MG  --   --   --   --  2.7*  PHOS  --   --   --   --  3.9   CrCl is unknown because there is no height on file for the current  visit.   LIVER  Recent Labs Lab 03/12/13 0640 03/15/13 0740  AST 66* 63*  ALT 82* 108*  ALKPHOS 156* 102  BILITOT 0.6 1.3*  PROT 5.7* 5.0*  ALBUMIN 2.4* 2.3*     INFECTIOUS  Recent Labs Lab 03/14/13 1340  LATICACIDVEN 2.3*  PROCALCITON <0.10     ENDOCRINE CBG (last 3)  No results found for this basename: GLUCAP,  in the last 72 hours       IMAGING x48h  Dg Chest Port 1 View  03/14/2013   CLINICAL DATA:   Pneumonia.  EXAM: PORTABLE CHEST - 1 VIEW  COMPARISON:  03/08/2013  FINDINGS: Tracheostomy and PICC line positioning are stable. Prominent bilateral lower lung and perihilar infiltrates are again noted with potentially slight worsening. No associated edema is identified. There may be small bilateral pleural effusions.  IMPRESSION: Persistent prominent bilateral pneumonia with potential slight worsening.   Electronically Signed   By: Irish Lack M.D.   On: 03/14/2013 12:38      ASSESSMENT / PLAN:  Acute respiratory failure - Multifactorial: aspiration pna, prob element of pulmonary edema, bilateral effusions, fungemia Recurrent mucous plugging and ATX Volume overload Failure to wean Tracheostomy status  03/14/13: needing high level 02 and does not meet sbt criterial   Plan: - Cont vent wean protocol , Minimise versed with daily WUA - Cont diuresis with K repletion - pulmonary hygiene as able, mobilize / upright positioning as able -Not weanable due to her progressive deconditioning, RLL VAP & fungal sepsis  Needs vent/snf v comfort  Yeast sepsis -non albicans candida  Plan: -  defer to ID   Persistent ileus - per primary team -would start reglan & retry Tfs  Thrombocytopenia-sepsis vs med related -dc heparin 7 rechk  Ana sarca  -Lasix should be IV  All other medical issues per [facility].  SIRS / Sepsis Anasarca Hypokalemia Dysphagia Abdominal Abscesses - s/p IR Perc Drain 11/26 Anemia Hx of CP / MR  Global: [facility] team exploring goal of care as comfort.   Brett Canales Minor ACNP [facility] Pager [phone] till 3 pm If no answer page [phone]  Independently examined pt, evaluated data & formulated above care plan with NP who scribed this note & edited by me.  Quincy Prisco V.  [phone] 03/15/2013, 1:41 PM

## 2013-03-17 ENCOUNTER — Other Ambulatory Visit (HOSPITAL_COMMUNITY): Payer: Medicare Other

## 2013-03-17 LAB — APTT: aPTT: 21 seconds — ABNORMAL LOW (ref 24–37)

## 2013-03-17 LAB — CBC
HCT: 23.7 % — ABNORMAL LOW (ref 36.0–46.0)
Hemoglobin: 7.3 g/dL — ABNORMAL LOW (ref 12.0–15.0)
MCHC: 30.8 g/dL (ref 30.0–36.0)
Platelets: 129 10*3/uL — ABNORMAL LOW (ref 150–400)
RDW: 22.2 % — ABNORMAL HIGH (ref 11.5–15.5)
WBC: 23.3 10*3/uL — ABNORMAL HIGH (ref 4.0–10.5)

## 2013-03-17 LAB — PROTIME-INR
INR: 1.07 (ref 0.00–1.49)
Prothrombin Time: 13.7 seconds (ref 11.6–15.2)

## 2013-03-17 NOTE — Progress Notes (Signed)
PULMONARY  / CRITICAL CARE MEDICINE  Name: Michelle Cardenas MRN: [id number] DOB: 01-09-1935    ADMISSION DATE:  02/09/2013 CONSULTATION DATE:  11/19  REFERRING MD :  11/19 PRIMARY SERVICE:  [facility]   CHIEF COMPLAINT:   Respiratory failure   BRIEF PATIENT DESCRIPTION:  78 year old female with PMH of CP, dysphagia and MR. Resides at [facility]. Admitted to outside hospital for acute respiratory failure and septic shock in setting of PCM, aspiration PNA, staph epidermis bacteremia (was likely contaminant). She was treated w/ broad spec abx, fluid resuscitation, and mechanical ventilation. Extubated at outside hospital. Transferred to [facility] on 11/18 for continued abx and persistent high FIO2 needs. PCCM asked to see on 11/19 for persistent respiratory failure   SIGNIFICANT EVENTS / STUDIES:  11/25 - CT abd>> Small rim enhancing fluid collections in right iliacus muscle measuring up to 3.6 cm, consistent with small abscesses.Mild ascites and diffuse body wall edema. Small bilateral pleural effusions and bibasilar atelectasis also noted. 11/26 - CT guided needle asp >> small amt blood c/w hematoma  12/17 - failed weaning with increased RR 12-29 [facility] exploring withdrawal of care with goal of comfort.  LINES / TUBES: Left [place] CVL>> oett 11/21>>>12/5 Trach 12/5 >>  CULTURES: Outside facility Blood cultures: staph epidermis Sputum: e coli and proteus C diff positive  Abscess 11/26: neg ............................................. BCx2 12/12>>>candida parapsilosis UC 12/12>>>proteus Mirabilis>>>sens rocephin BCx2 12/16>>>ng Cath Tip 12/16>>>ng  ANTIBIOTICS: clinda 11/17>>>off Oral vanc 11/17>>> zyvox and cefepime 8 days outside facility Oral vanc X 12 d prior to admit deficid 10 d prior to admit  ......................................................... 12/14 Rocephin>>> 12/08 Vanco>>> 12/15 fluconazole >>12/15 12/15 caspo >>  SUBJECTIVE:    In bed, no obvious pain.  Off versed gtt   VITAL  SIGNS:  Vital signs reviewed. Abnormal values will appear under impression plan section.    PHYSICAL EXAMINATION: General:  Chronically ill appearing white female, NAD.   Neuro:  Not following commands HEENT:  No JVD Cardiovascular:  Reg irreg  Lungs:  Resps even non labored, lungs bilaterally coarse, diminished bases, oral secretions Abdomen:  Round/soft, bsx4 active Musculoskeletal:  Intact Skin:  Diffuse anasarca   PULMONARY No results found for this basename: PHART, PCO2, PCO2ART, PO2, PO2ART, HCO3, TCO2, O2SAT,  in the last 168 hours  CBC  Recent Labs Lab 03/14/13 1340 03/15/13 0740 03/17/13 1405  HGB 8.4* 7.0* 7.3*  HCT 27.7* 23.1* 23.7*  WBC 39.0* 20.0* 23.3*  PLT 237 125* 129*    COAGULATION No results found for this basename: INR,  in the last 168 hours  CARDIAC  No results found for this basename: TROPONINI,  in the last 168 hours No results found for this basename: PROBNP,  in the last 168 hours   CHEMISTRY  Recent Labs Lab 03/11/13 0628 03/12/13 0640 03/13/13 0640 03/14/13 1340 03/15/13 0740  NA 146* 147* 148* 150* 153*  K 3.2* 4.1 4.0 4.7 3.6  CL 103 104 106 109 112  CO2 31 33* 32 33* 31  GLUCOSE 170* 174* 208* 278* 271*  BUN 86* 90* 85* 87* 87*  CREATININE 0.88 0.87 0.91 0.91 0.86  CALCIUM 8.3* 8.4 8.3* 8.4 8.2*  MG  --   --   --   --  2.7*  PHOS  --   --   --   --  3.9   CrCl is unknown because there is no height on file for the current visit.   LIVER  Recent Labs Lab 03/12/13 [phone] 03/15/13 0740  AST 66* 63*  ALT 82* 108*  ALKPHOS 156* 102  BILITOT 0.6 1.3*  PROT 5.7* 5.0*  ALBUMIN 2.4* 2.3*     INFECTIOUS  Recent Labs Lab 03/14/13 1340  LATICACIDVEN 2.3*  PROCALCITON <0.10     ENDOCRINE CBG (last 3)  No results found for this basename: GLUCAP,  in the last 72 hours       IMAGING  12/28 persistent BL pneumonia  No results found.    ASSESSMENT / PLAN:  Acute respiratory failure - Multifactorial:  aspiration pna, prob element of pulmonary edema, bilateral effusions, fungemia Recurrent mucous plugging and ATX Volume overload Failure to wean Tracheostomy status  Plan: - Cont vent wean protocol , Minimise versed with daily WUA - Cont diuresis with K repletion - pulmonary hygiene as able, mobilize / upright positioning as able -Not weaning due to her progressive deconditioning, RLL VAP & fungal sepsis  Needs vent/snf v comfort  Yeast sepsis -non albicans candida  Plan: -  Antifungals per ID  Persistent ileus - per primary team -would start reglan & retry Tfs  Thrombocytopenia-sepsis vs med related -dc heparin & follow   Ana sarca  -Lasix  IV, watch renal function  All other medical issues per [facility].  SIRS / Sepsis Anasarca Hypokalemia Dysphagia Abdominal Abscesses - s/p IR Perc Drain 11/26 Anemia Hx of CP / MR    [name],RAKESH V.  [phone] 03/17/2013, 2:49 PM

## 2013-03-18 LAB — CBC
HEMATOCRIT: 23.2 % — AB (ref 36.0–46.0)
Hemoglobin: 7 g/dL — ABNORMAL LOW (ref 12.0–15.0)
MCH: 31.3 pg (ref 26.0–34.0)
MCHC: 30.2 g/dL (ref 30.0–36.0)
MCV: 103.6 fL — AB (ref 78.0–100.0)
Platelets: 127 10*3/uL — ABNORMAL LOW (ref 150–400)
RBC: 2.24 MIL/uL — AB (ref 3.87–5.11)
RDW: 22.4 % — ABNORMAL HIGH (ref 11.5–15.5)
WBC: 21.4 10*3/uL — AB (ref 4.0–10.5)

## 2013-03-18 LAB — VANCOMYCIN, TROUGH: Vancomycin Tr: 22.4 ug/mL — ABNORMAL HIGH (ref 10.0–20.0)

## 2013-03-18 LAB — PREPARE RBC (CROSSMATCH)

## 2013-03-19 LAB — CBC
HCT: 31.7 % — ABNORMAL LOW (ref 36.0–46.0)
Hemoglobin: 10.2 g/dL — ABNORMAL LOW (ref 12.0–15.0)
MCH: 31.2 pg (ref 26.0–34.0)
MCHC: 32.2 g/dL (ref 30.0–36.0)
MCV: 96.9 fL (ref 78.0–100.0)
PLATELETS: 109 10*3/uL — AB (ref 150–400)
RBC: 3.27 MIL/uL — ABNORMAL LOW (ref 3.87–5.11)
RDW: 22.4 % — ABNORMAL HIGH (ref 11.5–15.5)
WBC: 22.4 10*3/uL — ABNORMAL HIGH (ref 4.0–10.5)

## 2013-03-19 LAB — BASIC METABOLIC PANEL
BUN: 73 mg/dL — ABNORMAL HIGH (ref 6–23)
CALCIUM: 8.2 mg/dL — AB (ref 8.4–10.5)
CO2: 27 mEq/L (ref 19–32)
Chloride: 110 mEq/L (ref 96–112)
Creatinine, Ser: 0.63 mg/dL (ref 0.50–1.10)
GFR calc non Af Amer: 85 mL/min — ABNORMAL LOW (ref >90)
Glucose, Bld: 110 mg/dL — ABNORMAL HIGH (ref 70–99)
Potassium: 3.5 mEq/L — ABNORMAL LOW (ref 3.7–5.3)
SODIUM: 153 meq/L — AB (ref 137–147)

## 2013-03-19 LAB — PRO B NATRIURETIC PEPTIDE: PRO B NATRI PEPTIDE: 1141 pg/mL — AB (ref 0–450)

## 2013-03-20 ENCOUNTER — Other Ambulatory Visit (HOSPITAL_COMMUNITY): Payer: Medicare Other

## 2013-03-20 LAB — BASIC METABOLIC PANEL
BUN: 67 mg/dL — ABNORMAL HIGH (ref 6–23)
CHLORIDE: 110 meq/L (ref 96–112)
CO2: 27 mEq/L (ref 19–32)
CREATININE: 0.66 mg/dL (ref 0.50–1.10)
Calcium: 8 mg/dL — ABNORMAL LOW (ref 8.4–10.5)
GFR calc non Af Amer: 84 mL/min — ABNORMAL LOW (ref >90)
Glucose, Bld: 137 mg/dL — ABNORMAL HIGH (ref 70–99)
Potassium: 2.8 mEq/L — CL (ref 3.7–5.3)
Sodium: 151 mEq/L — ABNORMAL HIGH (ref 137–147)

## 2013-03-20 LAB — TYPE AND SCREEN
ABO/RH(D): A POS
ANTIBODY SCREEN: NEGATIVE
UNIT DIVISION: 0
Unit division: 0

## 2013-03-20 LAB — CULTURE, BLOOD (ROUTINE X 2): Culture: NO GROWTH

## 2013-03-20 LAB — CBC
HEMATOCRIT: 31.6 % — AB (ref 36.0–46.0)
Hemoglobin: 9.9 g/dL — ABNORMAL LOW (ref 12.0–15.0)
MCH: 30.9 pg (ref 26.0–34.0)
MCHC: 31.3 g/dL (ref 30.0–36.0)
MCV: 98.8 fL (ref 78.0–100.0)
PLATELETS: 120 10*3/uL — AB (ref 150–400)
RBC: 3.2 MIL/uL — ABNORMAL LOW (ref 3.87–5.11)
RDW: 22.9 % — AB (ref 11.5–15.5)
WBC: 23.2 10*3/uL — AB (ref 4.0–10.5)

## 2013-03-21 LAB — CULTURE, BLOOD (ROUTINE X 2): Culture: NO GROWTH

## 2013-03-21 LAB — VANCOMYCIN, TROUGH: Vancomycin Tr: 20.6 ug/mL — ABNORMAL HIGH (ref 10.0–20.0)

## 2013-03-22 LAB — COMPREHENSIVE METABOLIC PANEL
ALBUMIN: 2.3 g/dL — AB (ref 3.5–5.2)
ALK PHOS: 103 U/L (ref 39–117)
ALT: 90 U/L — ABNORMAL HIGH (ref 0–35)
AST: 40 U/L — ABNORMAL HIGH (ref 0–37)
BUN: 71 mg/dL — ABNORMAL HIGH (ref 6–23)
CO2: 23 mEq/L (ref 19–32)
Calcium: 8.2 mg/dL — ABNORMAL LOW (ref 8.4–10.5)
Chloride: 110 mEq/L (ref 96–112)
Creatinine, Ser: 0.59 mg/dL (ref 0.50–1.10)
GFR calc Af Amer: >90 mL/min (ref >90)
GFR calc non Af Amer: 87 mL/min — ABNORMAL LOW (ref >90)
GLUCOSE: 159 mg/dL — AB (ref 70–99)
POTASSIUM: 4.8 meq/L (ref 3.7–5.3)
Sodium: 145 mEq/L (ref 137–147)
TOTAL PROTEIN: 5.4 g/dL — AB (ref 6.0–8.3)
Total Bilirubin: 1.1 mg/dL (ref 0.3–1.2)

## 2013-03-22 LAB — DIFFERENTIAL
Basophils Absolute: 0 10*3/uL (ref 0.0–0.1)
Basophils Relative: 0 % (ref 0–1)
EOS PCT: 3 % (ref 0–5)
Eosinophils Absolute: 0.3 10*3/uL (ref 0.0–0.7)
LYMPHS ABS: 0.5 10*3/uL — AB (ref 0.7–4.0)
Lymphocytes Relative: 5 % — ABNORMAL LOW (ref 12–46)
MONOS PCT: 4 % (ref 3–12)
Monocytes Absolute: 0.4 10*3/uL (ref 0.1–1.0)
NEUTROS ABS: 9.1 10*3/uL — AB (ref 1.7–7.7)
Neutrophils Relative %: 88 % — ABNORMAL HIGH (ref 43–77)

## 2013-03-22 LAB — CBC
HEMATOCRIT: 31 % — AB (ref 36.0–46.0)
Hemoglobin: 9.6 g/dL — ABNORMAL LOW (ref 12.0–15.0)
MCH: 31.2 pg (ref 26.0–34.0)
MCHC: 31 g/dL (ref 30.0–36.0)
MCV: 100.6 fL — ABNORMAL HIGH (ref 78.0–100.0)
Platelets: 108 10*3/uL — ABNORMAL LOW (ref 150–400)
RBC: 3.08 MIL/uL — AB (ref 3.87–5.11)
RDW: 22.1 % — ABNORMAL HIGH (ref 11.5–15.5)
WBC: 10.3 10*3/uL (ref 4.0–10.5)

## 2013-03-22 LAB — PREALBUMIN: Prealbumin: 33.9 mg/dL (ref 17.0–34.0)

## 2013-03-22 LAB — MAGNESIUM: Magnesium: 2.7 mg/dL — ABNORMAL HIGH (ref 1.5–2.5)

## 2013-03-22 LAB — PHOSPHORUS: Phosphorus: 4 mg/dL (ref 2.3–4.6)

## 2013-03-22 LAB — TRIGLYCERIDES: Triglycerides: 141 mg/dL (ref <150)

## 2013-03-22 NOTE — Progress Notes (Signed)
Name: Michelle CoppMaxine Cardenas MRN: [id number] DOB: 11/15/1934    ADMISSION DATE:  02/13/2013 CONSULTATION DATE:  11/19  REFERRING MD :  11/19 [facility]   CHIEF COMPLAINT:   Respiratory failure   BRIEF PATIENT DESCRIPTION:  79 year old female with PMH of CP, dysphagia and MR. [facility] Admitted to outside hospital for acute respiratory failure and septic shock in setting of PCM, aspiration PNA, staph epidermis bacteremia (was likely contaminant). She was treated w/ broad spec abx, fluid resuscitation, and mechanical ventilation. Extubated at outside hospital. [facility]x and persistent high FIO2 needs. PCCM asked to see on 11/19 for persistent respiratory failure   SIGNIFICANT EVENTS / STUDIES:  11/25 - CT abd>> Small rim enhancing fluid collections in right iliacus muscle measuring up to 3.6 cm, consistent with small abscesses.Mild ascites and diffuse body wall edema. Small bilateral pleural effusions and bibasilar atelectasis also noted. 11/26 - CT guided needle asp >> small amt blood c/w hematoma  12/5 trach>>> 12/17 - failed weaning with increased RR 12-[facility] with goal of comfort.  LINES / TUBES: Left [place] CVL>> oett 11/21>>>12/5 Trach 12/5 >>  CULTURES: Outside facility Blood cultures: staph epidermis Sputum: e coli and proteus C diff positive  Abscess 11/26: neg ............................................. BCx2 12/12>>>candida parapsilosis UC 12/12>>>proteus Mirabilis>>>sens rocephin BCx2 12/16>>>ng Cath Tip 12/16>>>ng  ANTIBIOTICS: clinda 11/17>>>off Oral vanc 11/17>>> zyvox and cefepime 8 days outside facility Oral vanc X 12 d prior to admit deficid 10 d prior to admit  ......................................................... 12/14 Rocephin>>> 12/08 Vanco>>> 12/15 fluconazole >>12/15 12/15 caspo >>  SUBJECTIVE:  No sig change   VITAL SIGNS:  Vital signs reviewed. Abnormal values  will appear under impression plan section.    PHYSICAL EXAMINATION: General:  Chronically ill appearing white female, NAD.   Neuro:  Not following commands HEENT:  No JVD Cardiovascular:  Reg irreg  Lungs:  Resps even non labored, lungs bilaterally coarse, diminished bases, oral secretions Abdomen:  Round/soft, bsx4 active Musculoskeletal:  Intact Skin:  Diffuse anasarca   PULMONARY No results found for this basename: PHART, PCO2, PCO2ART, PO2, PO2ART, HCO3, TCO2, O2SAT,  in the last 168 hours Bilateral effusions R>L  CBC  Recent Labs Lab 03/19/13 1255 03/20/13 0526 03/22/13 0730  HGB 10.2* 9.9* 9.6*  HCT 31.7* 31.6* 31.0*  WBC 22.4* 23.2* 10.3  PLT 109* 120* PENDING    COAGULATION  Recent Labs Lab 03/17/13 1405  INR 1.07    CARDIAC  No results found for this basename: TROPONINI,  in the last 168 hours  Recent Labs Lab 03/19/13 1100  PROBNP 1141.0*     CHEMISTRY  Recent Labs Lab 03/19/13 0930 03/20/13 0526  NA 153* 151*  K 3.5* 2.8*  CL 110 110  CO2 27 27  GLUCOSE 110* 137*  BUN 73* 67*  CREATININE 0.63 0.66  CALCIUM 8.2* 8.0*   CrCl is unknown because there is no height on file for the current visit.   LIVER  Recent Labs Lab 03/17/13 1405  INR 1.07     INFECTIOUS No results found for this basename: LATICACIDVEN, PROCALCITON,  in the last 168 hours   ENDOCRINE CBG (last 3)  No results found for this basename: GLUCAP,  in the last 72 hours   IMAGING /28 persistent BL pneumonia No results found.  ASSESSMENT / PLAN:  Acute respiratory failure - Multifactorial: aspiration pna, prob element of pulmonary edema, bilateral effusions, fungemia Recurrent mucous  plugging and ATX Volume overload Failure to wean Tracheostomy status Plan: - Cont vent wean protocol , Minimise versed with daily WUA - Cont diuresis with K repletion - pulmonary hygiene as able, mobilize / upright positioning as able -Not weaning due to her progressive  deconditioning, RLL VAP & fungal sepsis  Needs vent/snf v comfort, would favor comfort   Yeast sepsis -non albicans candida Plan: -  Antifungals per ID  Persistent ileus - per primary team -would start reglan & retry Tfs  Thrombocytopenia-sepsis vs med related -dc heparin & follow   Ana sarca  -Lasix  IV, watch renal function  All other medical issues per [facility].  SIRS / Sepsis Anasarca Hypokalemia Hypernatremia  Dysphagia Abdominal Abscesses - s/p IR Perc Drain 11/26 Anemia Hx of CP / MR    BABCOCK,PETE  [phone] 03/22/2013, 8:49 AM   Reviewed above, examined pt, and agree with assessment/plan.  Her long term outcome for meaningful functional recovery seems grim.  Agree with ongoing discussions about goals of care >> comfort measures likely best option.  Coralyn Helling, MD [facility] Pulmonary/Critical Care 03/22/2013, 11:27 AM Pager:  [phone] After 3pm call: [phone]

## 2013-03-24 ENCOUNTER — Other Ambulatory Visit (HOSPITAL_COMMUNITY): Payer: Medicare Other

## 2013-03-24 LAB — CBC
HCT: 26.8 % — ABNORMAL LOW (ref 36.0–46.0)
HEMOGLOBIN: 8.4 g/dL — AB (ref 12.0–15.0)
MCH: 31.5 pg (ref 26.0–34.0)
MCHC: 31.3 g/dL (ref 30.0–36.0)
MCV: 100.4 fL — ABNORMAL HIGH (ref 78.0–100.0)
PLATELETS: 95 10*3/uL — AB (ref 150–400)
RBC: 2.67 MIL/uL — AB (ref 3.87–5.11)
RDW: 21.8 % — ABNORMAL HIGH (ref 11.5–15.5)
WBC: 5.9 10*3/uL (ref 4.0–10.5)

## 2013-03-24 LAB — BASIC METABOLIC PANEL
BUN: 54 mg/dL — ABNORMAL HIGH (ref 6–23)
CO2: 26 mEq/L (ref 19–32)
Calcium: 8.1 mg/dL — ABNORMAL LOW (ref 8.4–10.5)
Chloride: 108 mEq/L (ref 96–112)
Creatinine, Ser: 0.5 mg/dL (ref 0.50–1.10)
GFR calc Af Amer: >90 mL/min (ref >90)
GLUCOSE: 134 mg/dL — AB (ref 70–99)
POTASSIUM: 3.3 meq/L — AB (ref 3.7–5.3)
Sodium: 145 mEq/L (ref 137–147)

## 2013-03-26 LAB — CBC
HCT: 26.8 % — ABNORMAL LOW (ref 36.0–46.0)
HEMOGLOBIN: 8.8 g/dL — AB (ref 12.0–15.0)
MCH: 32.8 pg (ref 26.0–34.0)
MCHC: 32.8 g/dL (ref 30.0–36.0)
MCV: 100 fL (ref 78.0–100.0)
Platelets: 103 10*3/uL — ABNORMAL LOW (ref 150–400)
RBC: 2.68 MIL/uL — AB (ref 3.87–5.11)
RDW: 21.6 % — ABNORMAL HIGH (ref 11.5–15.5)
WBC: 3.9 10*3/uL — AB (ref 4.0–10.5)

## 2013-03-26 LAB — BASIC METABOLIC PANEL
BUN: 45 mg/dL — ABNORMAL HIGH (ref 6–23)
CO2: 27 meq/L (ref 19–32)
Calcium: 7.7 mg/dL — ABNORMAL LOW (ref 8.4–10.5)
Chloride: 105 mEq/L (ref 96–112)
Creatinine, Ser: 0.51 mg/dL (ref 0.50–1.10)
GFR calc Af Amer: >90 mL/min (ref >90)
GFR calc non Af Amer: >90 mL/min (ref >90)
Glucose, Bld: 183 mg/dL — ABNORMAL HIGH (ref 70–99)
POTASSIUM: 4 meq/L (ref 3.7–5.3)
SODIUM: 143 meq/L (ref 137–147)

## 2013-03-29 ENCOUNTER — Other Ambulatory Visit (HOSPITAL_COMMUNITY): Payer: Medicare Other

## 2013-03-29 LAB — COMPREHENSIVE METABOLIC PANEL
ALK PHOS: 82 U/L (ref 39–117)
ALT: 44 U/L — AB (ref 0–35)
AST: 45 U/L — ABNORMAL HIGH (ref 0–37)
Albumin: 1.9 g/dL — ABNORMAL LOW (ref 3.5–5.2)
BUN: 46 mg/dL — ABNORMAL HIGH (ref 6–23)
CO2: 33 mEq/L — ABNORMAL HIGH (ref 19–32)
Calcium: 7.5 mg/dL — ABNORMAL LOW (ref 8.4–10.5)
Chloride: 94 mEq/L — ABNORMAL LOW (ref 96–112)
Creatinine, Ser: 0.57 mg/dL (ref 0.50–1.10)
GFR calc Af Amer: >90 mL/min (ref >90)
GFR calc non Af Amer: 88 mL/min — ABNORMAL LOW (ref >90)
GLUCOSE: 122 mg/dL — AB (ref 70–99)
POTASSIUM: 3 meq/L — AB (ref 3.7–5.3)
SODIUM: 138 meq/L (ref 137–147)
Total Bilirubin: 0.7 mg/dL (ref 0.3–1.2)
Total Protein: 4.4 g/dL — ABNORMAL LOW (ref 6.0–8.3)

## 2013-03-29 LAB — CBC
HEMATOCRIT: 27.2 % — AB (ref 36.0–46.0)
Hemoglobin: 9 g/dL — ABNORMAL LOW (ref 12.0–15.0)
MCH: 32 pg (ref 26.0–34.0)
MCHC: 33.1 g/dL (ref 30.0–36.0)
MCV: 96.8 fL (ref 78.0–100.0)
PLATELETS: 148 10*3/uL — AB (ref 150–400)
RBC: 2.81 MIL/uL — ABNORMAL LOW (ref 3.87–5.11)
RDW: 19 % — AB (ref 11.5–15.5)
WBC: 6.3 10*3/uL (ref 4.0–10.5)

## 2013-03-29 LAB — DIFFERENTIAL
BASOS ABS: 0 10*3/uL (ref 0.0–0.1)
Basophils Relative: 0 % (ref 0–1)
EOS PCT: 1 % (ref 0–5)
Eosinophils Absolute: 0 10*3/uL (ref 0.0–0.7)
LYMPHS ABS: 0.5 10*3/uL — AB (ref 0.7–4.0)
Lymphocytes Relative: 7 % — ABNORMAL LOW (ref 12–46)
MONO ABS: 0.5 10*3/uL (ref 0.1–1.0)
Monocytes Relative: 9 % (ref 3–12)
Neutro Abs: 5.3 10*3/uL (ref 1.7–7.7)
Neutrophils Relative %: 84 % — ABNORMAL HIGH (ref 43–77)

## 2013-03-29 LAB — MAGNESIUM: Magnesium: 1.8 mg/dL (ref 1.5–2.5)

## 2013-03-29 LAB — PHOSPHORUS: Phosphorus: 4 mg/dL (ref 2.3–4.6)

## 2013-03-29 LAB — PREALBUMIN: Prealbumin: 27.1 mg/dL (ref 17.0–34.0)

## 2013-03-29 LAB — TRIGLYCERIDES: Triglycerides: 293 mg/dL — ABNORMAL HIGH (ref <150)

## 2013-03-29 NOTE — Progress Notes (Signed)
I have had extensive discussions with state decision maker Michelle Cardenas. We discussed patients current circumstances and organ failures. We also discussed patient's prior wishes under circumstances such as this. Mrs Michelle Cardenas has decided to NOT perform resuscitation if arrest but to continue current medical support for now. And once off vent no re vent, no pressors ever.  Confirmed by RN  Mcarthur Rossettianiel J. Tyson AliasFeinstein, MD, FACP Pgr: [phone] [facility]

## 2013-03-29 NOTE — Progress Notes (Signed)
Name: Michelle Cardenas MRN: [id number] DOB: 09/28/1934    ADMISSION DATE:  02/12/2013 CONSULTATION DATE:  11/19  REFERRING MD :  11/19 [facility]   CHIEF COMPLAINT:   Respiratory failure   BRIEF PATIENT DESCRIPTION:  79 year old female with PMH of CP, dysphagia and MR. [facility] Admitted to outside hospital for acute respiratory failure and septic shock in setting of PCM, aspiration PNA, staph epidermis bacteremia (was likely contaminant). She was treated w/ broad spec abx, fluid resuscitation, and mechanical ventilation. Extubated at outside hospital. [facility] and persistent high FIO2 needs. PCCM asked to see on 11/19 for persistent respiratory failure   SIGNIFICANT EVENTS / STUDIES:  11/25 - CT abd>> Small rim enhancing fluid collections in right iliacus muscle measuring up to 3.6 cm, consistent with small abscesses.Mild ascites and diffuse body wall edema. Small bilateral pleural effusions and bibasilar atelectasis also noted. 11/26 - CT guided needle asp >> small amt blood c/w hematoma  12/5 trach>>> 12/17 - failed weaning with increased RR 12-29 [facility] care with goal of comfort. 1/12 TC trial  LINES / TUBES: Left [place] CVL>> oett 11/21>>>12/5 Trach 12/5 >>  CULTURES: Outside facility Blood cultures: staph epidermis Sputum: e coli and proteus C diff positive  Abscess 11/26: neg ............................................. BCx2 12/12>>>candida parapsilosis UC 12/12>>>proteus Mirabilis>>>sens rocephin BCx2 12/16>>>ng Cath Tip 12/16>>>ng  ANTIBIOTICS: clinda 11/17>>>off Oral vanc 11/17>>> zyvox and cefepime 8 days outside facility Oral vanc X 12 d prior to admit deficid 10 d prior to admit  ......................................................... 12/14 Rocephin>>> 12/08 Vanco>>> 12/15 fluconazole >>12/15 12/15 caspo >>  SUBJECTIVE:  No sig change   VITAL SIGNS:  Vital signs reviewed.  Abnormal values will appear under impression plan section.    PHYSICAL EXAMINATION: General:  Chronically ill appearing white female, NAD.   Neuro:  Not following commands, limited response to pain HEENT:  No JVD Cardiovascular:  Reg irreg  Lungs:  coarse Abdomen:  Round/soft, bsx4 active Musculoskeletal:  Intact Skin:  Diffuse anasarca less  PULMONARY No results found for this basename: PHART, PCO2, PCO2ART, PO2, PO2ART, HCO3, TCO2, O2SAT,  in the last 168 hours Bilateral effusions R>L  CBC  Recent Labs Lab 03/24/13 0530 03/26/13 0538 03/29/13 0320  HGB 8.4* 8.8* 9.0*  HCT 26.8* 26.8* 27.2*  WBC 5.9 3.9* 6.3  PLT 95* 103* 148*    COAGULATION No results found for this basename: INR,  in the last 168 hours  CARDIAC  No results found for this basename: TROPONINI,  in the last 168 hours No results found for this basename: PROBNP,  in the last 168 hours   CHEMISTRY  Recent Labs Lab 03/24/13 0530 03/26/13 0538 03/29/13 0320  NA 145 143 138  K 3.3* 4.0 3.0*  CL 108 105 94*  CO2 26 27 33*  GLUCOSE 134* 183* 122*  BUN 54* 45* 46*  CREATININE 0.50 0.51 0.57  CALCIUM 8.1* 7.7* 7.5*  MG  --   --  1.8  PHOS  --   --  4.0   CrCl is unknown because there is no height on file for the current visit.   LIVER  Recent Labs Lab 03/29/13 0320  AST 45*  ALT 44*  ALKPHOS 82  BILITOT 0.7  PROT 4.4*  ALBUMIN 1.9*     INFECTIOUS No results found for this basename: LATICACIDVEN, PROCALCITON,  in the last 168 hours   ENDOCRINE CBG (last 3)  No  results found for this basename: GLUCAP,  in the last 72 hours   IMAGING /28 persistent BL pneumonia Dg Chest Port 1 View  03/29/2013   CLINICAL DATA:  Respiratory failure.  EXAM: PORTABLE CHEST - 1 VIEW  COMPARISON:  03/24/2013.  FINDINGS: Patient is rotated. Tracheostomy is grossly midline. Right PICC tip projects over the SVC. Heart size stable. Improved aeration in the left lung with persistent bibasilar dependent  airspace disease, left greater than right. Bilateral effusions, as before.  IMPRESSION: 1. Bibasilar airspace disease, left greater than right, with marked improvement in left upper lobe aeration. 2. Bilateral pleural effusions.   Electronically Signed   By: Leanna Battles M.D.   On: 03/29/2013 08:05    ASSESSMENT / PLAN:  Acute respiratory failure - Multifactorial: aspiration pna, prob element of pulmonary edema, bilateral effusions, fungemia Recurrent mucous plugging and ATX Volume overload Failure to wean ioverall Tracheostomy status Plan: - did TC 1 hr now to goal 2 -4 hrs -extensive d/w med poA, she is unlikely to ever return to her prior poor quality and she is suffering through this admission without benefit Would be medically ineffective and futile to continue aggressive support Wean to goal 4 hrs TC then med poa agree no further intitiation vent under any circumstances and comfort then if failing Also no pressors, NCB established  Yeast sepsis -non albicans candida Plan: -  Antifungals per ID  Persistent ileus - per primary team -would start reglan & retry Tfs, trickle  All other medical issues per [facility].  SIRS / Sepsis Anasarca Hypokalemia Hypernatremia  Dysphagia Abdominal Abscesses - s/p IR Perc Drain 11/26 Anemia Hx of CP / MR   Mcarthur Rossetti. Tyson Alias, MD, FACP Pgr: [phone] [place] [facility]

## 2013-03-30 LAB — POTASSIUM: Potassium: 3.1 mEq/L — ABNORMAL LOW (ref 3.7–5.3)

## 2013-03-30 NOTE — Progress Notes (Signed)
Name: Michelle Cardenas MRN: [id number] DOB: 09/20/1934    ADMISSION DATE:  02/02/2013 CONSULTATION DATE:  11/19  REFERRING MD :  11/19 [facility]   CHIEF COMPLAINT:   Respiratory failure   BRIEF PATIENT DESCRIPTION:  79 year old female with PMH of CP, dysphagia and MR. [facility] Admitted to outside hospital for acute respiratory failure and septic shock in setting of PCM, aspiration PNA, staph epidermis bacteremia (was likely contaminant). She was treated w/ broad spec abx, fluid resuscitation, and mechanical ventilation. Extubated at outside hospital. [facility] and persistent high FIO2 needs. PCCM asked to see on 11/19 for persistent respiratory failure   SIGNIFICANT EVENTS / STUDIES:  11/25 - CT abd>> Small rim enhancing fluid collections in right iliacus muscle measuring up to 3.6 cm, consistent with small abscesses.Mild ascites and diffuse body wall edema. Small bilateral pleural effusions and bibasilar atelectasis also noted. 11/26 - CT guided needle asp >> small amt blood c/w hematoma  12/5 trach>>> 12/17 - failed weaning with increased RR 12-29 [facility] with goal of comfort. 1/12 TC trial  LINES / TUBES: Left [place] CVL>> oett 11/21>>>12/5 Trach 12/5 >>  CULTURES: Outside facility Blood cultures: staph epidermis Sputum: e coli and proteus C diff positive  Abscess 11/26: neg ............................................. BCx2 12/12>>>candida parapsilosis UC 12/12>>>proteus Mirabilis>>>sens rocephin BCx2 12/16>>>ng Cath Tip 12/16>>>ng  ANTIBIOTICS: clinda 11/17>>>off Oral vanc 11/17>>> zyvox and cefepime 8 days outside facility Oral vanc X 12 d prior to admit deficid 10 d prior to admit  ......................................................... 12/14 Rocephin>>> 12/08 Vanco>>> 12/15 fluconazole >>12/15 12/15 caspo >>  SUBJECTIVE:  Improved trach collar trials, NCB established  VITAL  SIGNS:  Vital signs reviewed. Abnormal values will appear under impression plan section.    PHYSICAL EXAMINATION: General:  Chronically ill appearing white female, NAD.   Neuro:  Not following commands, more alert HEENT:  No JVD Cardiovascular:  Reg irreg  Lungs:  Coarse less anterior,m CTA, no distress on TC Abdomen:  Round/soft, bsx4 active Musculoskeletal:  Intact Skin:  Edema moderate  PULMONARY No results found for this basename: PHART, PCO2, PCO2ART, PO2, PO2ART, HCO3, TCO2, O2SAT,  in the last 168 hours Bilateral effusions R>L  CBC  Recent Labs Lab 03/24/13 0530 03/26/13 0538 03/29/13 0320  HGB 8.4* 8.8* 9.0*  HCT 26.8* 26.8* 27.2*  WBC 5.9 3.9* 6.3  PLT 95* 103* 148*    COAGULATION No results found for this basename: INR,  in the last 168 hours  CARDIAC  No results found for this basename: TROPONINI,  in the last 168 hours No results found for this basename: PROBNP,  in the last 168 hours   CHEMISTRY  Recent Labs Lab 03/24/13 0530 03/26/13 0538 03/29/13 0320 03/30/13 0706  NA 145 143 138  --   K 3.3* 4.0 3.0* 3.1*  CL 108 105 94*  --   CO2 26 27 33*  --   GLUCOSE 134* 183* 122*  --   BUN 54* 45* 46*  --   CREATININE 0.50 0.51 0.57  --   CALCIUM 8.1* 7.7* 7.5*  --   MG  --   --  1.8  --   PHOS  --   --  4.0  --    CrCl is unknown because there is no height on file for the current visit.   LIVER  Recent Labs Lab 03/29/13 0320  AST 45*  ALT 44*  ALKPHOS 82  BILITOT 0.7  PROT 4.4*  ALBUMIN 1.9*     INFECTIOUS No results found for this basename: LATICACIDVEN, PROCALCITON,  in the last 168 hours   ENDOCRINE CBG (last 3)  No results found for this basename: GLUCAP,  in the last 72 hours   IMAGING /28 persistent BL pneumonia Dg Chest Port 1 View  03/29/2013   CLINICAL DATA:  Respiratory failure.  EXAM: PORTABLE CHEST - 1 VIEW  COMPARISON:  03/24/2013.  FINDINGS: Patient is rotated. Tracheostomy is grossly midline. Right PICC tip  projects over the SVC. Heart size stable. Improved aeration in the left lung with persistent bibasilar dependent airspace disease, left greater than right. Bilateral effusions, as before.  IMPRESSION: 1. Bibasilar airspace disease, left greater than right, with marked improvement in left upper lobe aeration. 2. Bilateral pleural effusions.   Electronically Signed   By: Leanna Battles M.D.   On: 03/29/2013 08:05    ASSESSMENT / PLAN:  Acute respiratory failure - Multifactorial: aspiration pna, prob element of pulmonary edema, bilateral effusions, fungemia Recurrent mucous plugging and ATX Volume overload Failure to wean ioverall Tracheostomy status Plan: - looking well on TC 3-4 hours, keep cuff up -I think this is the best opportunity to dc vent for good with her progress -I called med poa back again ans she agrees and is realistic about her poor prognosis over time -comfort if fail -maintain medical treatments if she progresses    Mcarthur Rossetti. Tyson Alias, MD, FACP Pgr: [phone] [place]

## 2013-03-31 LAB — BASIC METABOLIC PANEL
BUN: 39 mg/dL — AB (ref 6–23)
CHLORIDE: 95 meq/L — AB (ref 96–112)
CO2: 34 mEq/L — ABNORMAL HIGH (ref 19–32)
Calcium: 8.4 mg/dL (ref 8.4–10.5)
Creatinine, Ser: 0.44 mg/dL — ABNORMAL LOW (ref 0.50–1.10)
GFR calc Af Amer: >90 mL/min (ref >90)
GFR calc non Af Amer: >90 mL/min (ref >90)
Glucose, Bld: 109 mg/dL — ABNORMAL HIGH (ref 70–99)
POTASSIUM: 3.4 meq/L — AB (ref 3.7–5.3)
Sodium: 141 mEq/L (ref 137–147)

## 2013-04-01 ENCOUNTER — Other Ambulatory Visit (HOSPITAL_COMMUNITY): Payer: Medicare Other

## 2013-04-01 LAB — MAGNESIUM: Magnesium: 1.9 mg/dL (ref 1.5–2.5)

## 2013-04-01 LAB — BASIC METABOLIC PANEL
BUN: 37 mg/dL — AB (ref 6–23)
CHLORIDE: 94 meq/L — AB (ref 96–112)
CO2: 34 meq/L — AB (ref 19–32)
Calcium: 8.3 mg/dL — ABNORMAL LOW (ref 8.4–10.5)
Creatinine, Ser: 0.49 mg/dL — ABNORMAL LOW (ref 0.50–1.10)
GFR calc Af Amer: >90 mL/min (ref >90)
GFR calc non Af Amer: >90 mL/min (ref >90)
Glucose, Bld: 161 mg/dL — ABNORMAL HIGH (ref 70–99)
Potassium: 4.1 mEq/L (ref 3.7–5.3)
Sodium: 140 mEq/L (ref 137–147)

## 2013-04-04 LAB — APTT: APTT: 37 s (ref 24–37)

## 2013-04-04 LAB — PROTIME-INR
INR: 0.99 (ref 0.00–1.49)
PROTHROMBIN TIME: 12.9 s (ref 11.6–15.2)

## 2013-04-05 DIAGNOSIS — Z930 Tracheostomy status: Secondary | ICD-10-CM

## 2013-04-05 DIAGNOSIS — J9620 Acute and chronic respiratory failure, unspecified whether with hypoxia or hypercapnia: Secondary | ICD-10-CM

## 2013-04-05 LAB — DIFFERENTIAL
BAND NEUTROPHILS: 18 % — AB (ref 0–10)
BASOS PCT: 0 % (ref 0–1)
Basophils Absolute: 0 10*3/uL (ref 0.0–0.1)
Eosinophils Absolute: 0 10*3/uL (ref 0.0–0.7)
Eosinophils Relative: 0 % (ref 0–5)
LYMPHS PCT: 2 % — AB (ref 12–46)
Lymphs Abs: 0.6 10*3/uL — ABNORMAL LOW (ref 0.7–4.0)
MONOS PCT: 8 % (ref 3–12)
Metamyelocytes Relative: 5 %
Monocytes Absolute: 2.4 10*3/uL — ABNORMAL HIGH (ref 0.1–1.0)
Myelocytes: 2 %
NEUTROS ABS: 26.9 10*3/uL — AB (ref 1.7–7.7)
Neutrophils Relative %: 65 % (ref 43–77)

## 2013-04-05 LAB — COMPREHENSIVE METABOLIC PANEL
ALT: 29 U/L (ref 0–35)
AST: 24 U/L (ref 0–37)
Albumin: 2.2 g/dL — ABNORMAL LOW (ref 3.5–5.2)
Alkaline Phosphatase: 137 U/L — ABNORMAL HIGH (ref 39–117)
BILIRUBIN TOTAL: 0.9 mg/dL (ref 0.3–1.2)
BUN: 57 mg/dL — ABNORMAL HIGH (ref 6–23)
CHLORIDE: 91 meq/L — AB (ref 96–112)
CO2: 29 mEq/L (ref 19–32)
CREATININE: 0.78 mg/dL (ref 0.50–1.10)
Calcium: 8 mg/dL — ABNORMAL LOW (ref 8.4–10.5)
GFR calc Af Amer: >90 mL/min (ref >90)
GFR calc non Af Amer: 79 mL/min — ABNORMAL LOW (ref >90)
Glucose, Bld: 155 mg/dL — ABNORMAL HIGH (ref 70–99)
Potassium: 2.6 mEq/L — CL (ref 3.7–5.3)
SODIUM: 139 meq/L (ref 137–147)
Total Protein: 5.8 g/dL — ABNORMAL LOW (ref 6.0–8.3)

## 2013-04-05 LAB — CBC
HCT: 36 % (ref 36.0–46.0)
Hemoglobin: 11.5 g/dL — ABNORMAL LOW (ref 12.0–15.0)
MCH: 30.6 pg (ref 26.0–34.0)
MCHC: 31.9 g/dL (ref 30.0–36.0)
MCV: 95.7 fL (ref 78.0–100.0)
Platelets: 522 10*3/uL — ABNORMAL HIGH (ref 150–400)
RBC: 3.76 MIL/uL — ABNORMAL LOW (ref 3.87–5.11)
RDW: 18.2 % — AB (ref 11.5–15.5)
WBC: 29.9 10*3/uL — AB (ref 4.0–10.5)

## 2013-04-05 LAB — APTT: aPTT: 29 seconds (ref 24–37)

## 2013-04-05 LAB — PROTIME-INR
INR: 1 (ref 0.00–1.49)
Prothrombin Time: 13 seconds (ref 11.6–15.2)

## 2013-04-05 LAB — PHOSPHORUS: PHOSPHORUS: 4 mg/dL (ref 2.3–4.6)

## 2013-04-05 LAB — POTASSIUM: Potassium: 4.7 mEq/L (ref 3.7–5.3)

## 2013-04-05 LAB — PREALBUMIN: Prealbumin: 16 mg/dL — ABNORMAL LOW (ref 17.0–34.0)

## 2013-04-05 LAB — MAGNESIUM: MAGNESIUM: 2.4 mg/dL (ref 1.5–2.5)

## 2013-04-05 LAB — TRIGLYCERIDES: Triglycerides: 322 mg/dL — ABNORMAL HIGH (ref <150)

## 2013-04-05 NOTE — Progress Notes (Signed)
Name: Michelle CoppMaxine Cardenas MRN: [id number] DOB: 01/14/1935    ADMISSION DATE:  01/28/2013 CONSULTATION DATE:  11/19  REFERRING MD :  11/19 [facility]   CHIEF COMPLAINT:   Respiratory failure   BRIEF PATIENT DESCRIPTION:  79 year old female with PMH of CP, dysphagia and MR. [facility] Admitted to outside hospital for acute respiratory failure and septic shock in setting of PCM, aspiration PNA, staph epidermis bacteremia (was likely contaminant). She was treated w/ broad spec abx, fluid resuscitation, and mechanical ventilation. Extubated at outside hospital. [facility] and persistent high FIO2 needs. PCCM asked to see on 11/19 for persistent respiratory failure   SIGNIFICANT EVENTS / STUDIES:  11/25 - CT abd>> Small rim enhancing fluid collections in right iliacus muscle measuring up to 3.6 cm, consistent with small abscesses.Mild ascites and diffuse body wall edema. Small bilateral pleural effusions and bibasilar atelectasis also noted. 11/26 - CT guided needle asp >> small amt blood c/w hematoma  12/5 trach>>> 12/17 - failed weaning with increased RR [facility] with goal of comfort. 1/12 TC trial 1/13: Improved trach collar trials, NCB established   LINES / TUBES: Left [place] CVL>> oett 11/21>>>12/5 Trach 12/5 >>  CULTURES: Outside facility Blood cultures: staph epidermis Sputum: e coli and proteus C diff positive  Abscess 11/26: neg ............................................. BCx2 12/12>>>candida parapsilosis UC 12/12>>>proteus Mirabilis>>>sens rocephin BCx2 12/16>>>ng Cath Tip 12/16>>>ng  ANTIBIOTICS: clinda 11/17>>>off Oral vanc 11/17>>> zyvox and cefepime 8 days outside facility Oral vanc X 12 d prior to admit deficid 10 d prior to admit  ......................................................... 12/14 Rocephin>>> 12/08 Vanco>>> 12/15 fluconazole >>12/15 12/15 caspo  >>  SUBJECTIVE  1/9: Trach colalr x 24. Some thiuck secretins. Ileus continues. On TPN:   VITAL SIGNS:  Vital signs reviewed. Abnormal values will appear under impression plan section.    PHYSICAL EXAMINATION: General:  Chronically ill appearing white female, NAD.   Neuro:  Not following commands, more alert HEENT:  No JVD Cardiovascular:  Reg irreg  Lungs:  Coarse less anterior,m CTA, no distress on TC Abdomen:  Round/soft, bsx4 active Musculoskeletal:  Intact Skin:  Edema moderate  PULMONARY No results found for this basename: PHART, PCO2, PCO2ART, PO2, PO2ART, HCO3, TCO2, O2SAT,  in the last 168 hours Bilateral effusions R>L  CBC  Recent Labs Lab 04/05/13 0555  HGB 11.5*  HCT 36.0  WBC 29.9*  PLT 522*    COAGULATION  Recent Labs Lab 04/04/13 1331 04/05/13 0555  INR 0.99 1.00    CARDIAC  No results found for this basename: TROPONINI,  in the last 168 hours No results found for this basename: PROBNP,  in the last 168 hours   CHEMISTRY  Recent Labs Lab 03/31/13 0505 04/01/13 0545 04/05/13 0555  NA 141 140 139  K 3.4* 4.1 2.6*  CL 95* 94* 91*  CO2 34* 34* 29  GLUCOSE 109* 161* 155*  BUN 39* 37* 57*  CREATININE 0.44* 0.49* 0.78  CALCIUM 8.4 8.3* 8.0*  MG  --  1.9 2.4  PHOS  --   --  4.0   CrCl is unknown because there is no height on file for the current visit.   LIVER  Recent Labs Lab 04/04/13 1331 04/05/13 0555  AST  --  24  ALT  --  29  ALKPHOS  --  137*  BILITOT  --  0.9  PROT  --  5.8*  ALBUMIN  --  2.2*  INR 0.99 1.00     INFECTIOUS No results found for this basename: LATICACIDVEN, PROCALCITON,  in the last 168 hours   ENDOCRINE CBG (last 3)  No results found for this basename: GLUCAP,  in the last 72 hours   IMAGING /28 persistent BL pneumonia No results found.  ASSESSMENT / PLAN:  Acute respiratory failure - Multifactorial: aspiration pna, prob element of pulmonary edema, bilateral effusions, fungemia Recurrent  mucous plugging and ATX Volume overload Failure to wean ioverall Tracheostomy status  04/05/13: On TC x 24h Plan: - contiue TC and TPN - aim for dc planing - full medical care with concurrent palliation. Comfort if she declines   Dr. Kalman Shan, M.D., [facility].C.P Pulmonary and Critical Care Medicine Staff Physician [facility] [place] [facility] Pager: [phone], If no answer or between  15:00h - 7:00h: call [phone]  04/05/2013 12:58 PM

## 2013-04-06 LAB — CBC
HCT: 32.1 % — ABNORMAL LOW (ref 36.0–46.0)
HEMOGLOBIN: 10.4 g/dL — AB (ref 12.0–15.0)
MCH: 31 pg (ref 26.0–34.0)
MCHC: 32.4 g/dL (ref 30.0–36.0)
MCV: 95.8 fL (ref 78.0–100.0)
Platelets: 465 10*3/uL — ABNORMAL HIGH (ref 150–400)
RBC: 3.35 MIL/uL — AB (ref 3.87–5.11)
RDW: 18.5 % — ABNORMAL HIGH (ref 11.5–15.5)
WBC: 35.5 10*3/uL — ABNORMAL HIGH (ref 4.0–10.5)

## 2013-04-06 LAB — BASIC METABOLIC PANEL
BUN: 60 mg/dL — ABNORMAL HIGH (ref 6–23)
CHLORIDE: 93 meq/L — AB (ref 96–112)
CO2: 28 meq/L (ref 19–32)
Calcium: 7.7 mg/dL — ABNORMAL LOW (ref 8.4–10.5)
Creatinine, Ser: 0.72 mg/dL (ref 0.50–1.10)
GFR calc Af Amer: >90 mL/min (ref >90)
GFR calc non Af Amer: 81 mL/min — ABNORMAL LOW (ref >90)
GLUCOSE: 106 mg/dL — AB (ref 70–99)
POTASSIUM: 3.4 meq/L — AB (ref 3.7–5.3)
Sodium: 136 mEq/L — ABNORMAL LOW (ref 137–147)

## 2013-04-07 LAB — CLOSTRIDIUM DIFFICILE BY PCR: CDIFFPCR: POSITIVE — AB

## 2013-04-07 LAB — BASIC METABOLIC PANEL
BUN: 67 mg/dL — ABNORMAL HIGH (ref 6–23)
BUN: 70 mg/dL — ABNORMAL HIGH (ref 6–23)
CALCIUM: 7.7 mg/dL — AB (ref 8.4–10.5)
CALCIUM: 7.9 mg/dL — AB (ref 8.4–10.5)
CO2: 27 mEq/L (ref 19–32)
CO2: 26 mEq/L (ref 19–32)
CREATININE: 0.8 mg/dL (ref 0.50–1.10)
Chloride: 87 mEq/L — ABNORMAL LOW (ref 96–112)
Chloride: 91 mEq/L — ABNORMAL LOW (ref 96–112)
Creatinine, Ser: 0.73 mg/dL (ref 0.50–1.10)
GFR, EST AFRICAN AMERICAN: 81 mL/min — AB (ref >90)
GFR, EST NON AFRICAN AMERICAN: 70 mL/min — AB (ref >90)
GFR, EST NON AFRICAN AMERICAN: 81 mL/min — AB (ref >90)
Glucose, Bld: 991 mg/dL (ref 70–99)
Glucose, Bld: 312 mg/dL — ABNORMAL HIGH (ref 70–99)
Potassium: 4.8 mEq/L (ref 3.7–5.3)
Potassium: 3.7 mEq/L (ref 3.7–5.3)
SODIUM: 134 meq/L — AB (ref 137–147)
Sodium: 128 mEq/L — ABNORMAL LOW (ref 137–147)

## 2013-04-07 LAB — CBC
HCT: 29.3 % — ABNORMAL LOW (ref 36.0–46.0)
Hemoglobin: 9.2 g/dL — ABNORMAL LOW (ref 12.0–15.0)
MCH: 31 pg (ref 26.0–34.0)
MCHC: 31.4 g/dL (ref 30.0–36.0)
MCV: 98.7 fL (ref 78.0–100.0)
PLATELETS: 540 10*3/uL — AB (ref 150–400)
RBC: 2.97 MIL/uL — ABNORMAL LOW (ref 3.87–5.11)
RDW: 19.1 % — AB (ref 11.5–15.5)
WBC: 30.3 10*3/uL — AB (ref 4.0–10.5)

## 2013-04-07 LAB — PROCALCITONIN: Procalcitonin: 0.84 ng/mL

## 2013-04-07 NOTE — Progress Notes (Signed)
Name: Michelle Cardenas MRN: [id number] DOB: [birth year]    ADMISSION DATE:  01/29/2013 CONSULTATION DATE:  11/19  REFERRING MD :  11/19 PRIMARY SERVICE:  [facility]   CHIEF COMPLAINT:   Respiratory failure   BRIEF PATIENT DESCRIPTION:  79 year old female with PMH of CP, dysphagia and MR. [facility] Admitted to outside hospital for acute respiratory failure and septic shock in setting of PCM, aspiration PNA, staph epidermis bacteremia (was likely contaminant). She was treated w/ broad spec abx, fluid resuscitation, and mechanical ventilation. Extubated at outside hospital. Transferred to [facility] on 11/18 for continued abx and persistent high FIO2 needs. PCCM asked to see on 11/19 for persistent respiratory failure   SIGNIFICANT EVENTS / STUDIES:  11/25 - CT abd>> Small rim enhancing fluid collections in right iliacus muscle measuring up to 3.6 cm, consistent with small abscesses.Mild ascites and diffuse body wall edema. Small bilateral pleural effusions and bibasilar atelectasis also noted. 11/26 - CT guided needle asp >> small amt blood c/w hematoma  12/5 trach>>> 12/17 - failed weaning with increased RR 12-29 [facility] exploring withdrawal of care with goal of comfort. 1/12 TC trial 1/13: Improved trach collar trials, NCB established 1/19: Trach colalr x 24. Some thiuck secretins. Ileus continues. On TPN:   LINES / TUBES: Left [place] CVL>> oett 11/21>>>12/5 Trach 12/5 >>  CULTURES: Outside facility Blood cultures: staph epidermis Sputum: e coli and proteus C diff positive  Abscess 11/26: neg ............................................. BCx2 12/12>>>candida parapsilosis UC 12/12>>>proteus Mirabilis>>>sens rocephin BCx2 12/16>>>ng Cath Tip 12/16>>>ng  ANTIBIOTICS: clinda 11/17>>>off Oral vanc 11/17>>> zyvox and cefepime 8 days outside facility Oral vanc X 12 d prior to admit deficid 10 d prior to admit  ......................................................... 12/14 Rocephin>>> ?  Date stopped 12/08 Vanco>>> ? Date stopped 12/15 fluconazole >>12/15 12/15 caspo >> ? Date stopped //////////////////// IV falgyl 04/06/13 >>>   SUBJECTIVE   04/07/13: Continues on TC x 24h. RT reports no problems on rounds  VITAL SIGNS:  T 98.9 P 100 R 18 BP 122/70 Pulse ox 96% Sugar 218mg %  Vital signs reviewed. Abnormal values will appear under impression plan section.    PHYSICAL EXAMINATION: General:  Chronically ill appearing white female, NAD.   Neuro:  Not following commands, more alert HEENT:  No JVD Cardiovascular:  Reg irreg  Lungs:  Coarse less anterior,m CTA, no distress on TC Abdomen:  Round/soft, bsx4 active Musculoskeletal:  Intact Skin:  Edema moderate  PULMONARY No results found for this basename: PHART, PCO2, PCO2ART, PO2, PO2ART, HCO3, TCO2, O2SAT,  in the last 168 hours Bilateral effusions R>L  CBC  Recent Labs Lab 04/05/13 0555 04/06/13 0745 04/07/13 0900  HGB 11.5* 10.4* 9.2*  HCT 36.0 32.1* 29.3*  WBC 29.9* 35.5* 30.3*  PLT 522* 465* 540*    COAGULATION  Recent Labs Lab 04/04/13 1331 04/05/13 0555  INR 0.99 1.00    CARDIAC  No results found for this basename: TROPONINI,  in the last 168 hours No results found for this basename: PROBNP,  in the last 168 hours   CHEMISTRY  Recent Labs Lab 04/01/13 0545 04/05/13 0555  04/06/13 0745 04/07/13 0900  NA 140 139  --  136* 128*  K 4.1 2.6*  < > 3.4* 4.8  CL 94* 91*  --  93* 87*  CO2 34* 29  --  28 27  GLUCOSE 161* 155*  --  106* 991*  BUN 37* 57*  --  60* 67*  CREATININE 0.49* 0.78  --  0.72  0.80  CALCIUM 8.3* 8.0*  --  7.7* 7.7*  MG 1.9 2.4  --   --   --   PHOS  --  4.0  --   --   --   < > = values in this interval not displayed. CrCl is unknown because there is no height on file for the current visit.   LIVER  Recent Labs Lab 04/04/13 1331 04/05/13 0555  AST  --  24  ALT  --  29  ALKPHOS  --  137*  BILITOT  --  0.9  PROT  --  5.8*  ALBUMIN  --  2.2*  INR  0.99 1.00     INFECTIOUS  Recent Labs Lab 04/07/13 0816  PROCALCITON 0.84     ENDOCRINE CBG (last 3)  No results found for this basename: GLUCAP,  in the last 72 hours   IMAGING /28 persistent BL pneumonia No results found.  ASSESSMENT / PLAN:  Acute respiratory failure - Multifactorial: aspiration pna, prob element of pulmonary edema, bilateral effusions, fungemia Recurrent mucous plugging and ATX Volume overload Failure to wean ioverall Tracheostomy status  03/2113: On TC x 72h Plan: - contiue TC and TPN - aim for dc planing - full medical care with concurrent palliation. Comfort if she declines   Dr. Kalman ShanMurali Shonta Phillis, M.D., [facility].C.P Pulmonary and Critical Care Medicine Staff Physician [facility]monary and Critical [facility] Pager: [phone], If no answer or between  15:00h - 7:00h: call [phone]  04/07/2013 12:04 PM

## 2013-04-09 LAB — CBC
HCT: 30.9 % — ABNORMAL LOW (ref 36.0–46.0)
Hemoglobin: 10.3 g/dL — ABNORMAL LOW (ref 12.0–15.0)
MCH: 30.8 pg (ref 26.0–34.0)
MCHC: 33.3 g/dL (ref 30.0–36.0)
MCV: 92.5 fL (ref 78.0–100.0)
Platelets: 492 10*3/uL — ABNORMAL HIGH (ref 150–400)
RBC: 3.34 MIL/uL — AB (ref 3.87–5.11)
RDW: 18.5 % — ABNORMAL HIGH (ref 11.5–15.5)
WBC: 36.7 10*3/uL — AB (ref 4.0–10.5)

## 2013-04-09 LAB — BASIC METABOLIC PANEL
BUN: 66 mg/dL — ABNORMAL HIGH (ref 6–23)
CO2: 24 meq/L (ref 19–32)
Calcium: 7.3 mg/dL — ABNORMAL LOW (ref 8.4–10.5)
Chloride: 95 mEq/L — ABNORMAL LOW (ref 96–112)
Creatinine, Ser: 0.64 mg/dL (ref 0.50–1.10)
GFR calc non Af Amer: 85 mL/min — ABNORMAL LOW (ref >90)
Glucose, Bld: 124 mg/dL — ABNORMAL HIGH (ref 70–99)
POTASSIUM: 4.3 meq/L (ref 3.7–5.3)
SODIUM: 134 meq/L — AB (ref 137–147)

## 2013-04-11 LAB — COMPREHENSIVE METABOLIC PANEL
ALT: 16 U/L (ref 0–35)
AST: 22 U/L (ref 0–37)
Albumin: 1.6 g/dL — ABNORMAL LOW (ref 3.5–5.2)
Alkaline Phosphatase: 123 U/L — ABNORMAL HIGH (ref 39–117)
BILIRUBIN TOTAL: 0.5 mg/dL (ref 0.3–1.2)
BUN: 57 mg/dL — AB (ref 6–23)
CO2: 24 meq/L (ref 19–32)
CREATININE: 0.63 mg/dL (ref 0.50–1.10)
Calcium: 6.9 mg/dL — ABNORMAL LOW (ref 8.4–10.5)
Chloride: 98 mEq/L (ref 96–112)
GFR calc Af Amer: >90 mL/min (ref >90)
GFR, EST NON AFRICAN AMERICAN: 85 mL/min — AB (ref >90)
Glucose, Bld: 155 mg/dL — ABNORMAL HIGH (ref 70–99)
Potassium: 4 mEq/L (ref 3.7–5.3)
Sodium: 136 mEq/L — ABNORMAL LOW (ref 137–147)
Total Protein: 4.6 g/dL — ABNORMAL LOW (ref 6.0–8.3)

## 2013-04-11 LAB — CBC
HCT: 33.9 % — ABNORMAL LOW (ref 36.0–46.0)
Hemoglobin: 11 g/dL — ABNORMAL LOW (ref 12.0–15.0)
MCH: 30.7 pg (ref 26.0–34.0)
MCHC: 32.4 g/dL (ref 30.0–36.0)
MCV: 94.7 fL (ref 78.0–100.0)
Platelets: 442 10*3/uL — ABNORMAL HIGH (ref 150–400)
RBC: 3.58 MIL/uL — AB (ref 3.87–5.11)
RDW: 18.8 % — ABNORMAL HIGH (ref 11.5–15.5)
WBC: 32.1 10*3/uL — ABNORMAL HIGH (ref 4.0–10.5)

## 2013-04-12 ENCOUNTER — Other Ambulatory Visit (HOSPITAL_COMMUNITY): Payer: Medicare Other

## 2013-04-12 LAB — DIFFERENTIAL
BASOS ABS: 0 10*3/uL (ref 0.0–0.1)
Basophils Relative: 0 % (ref 0–1)
EOS PCT: 0 % (ref 0–5)
Eosinophils Absolute: 0 10*3/uL (ref 0.0–0.7)
LYMPHS PCT: 3 % — AB (ref 12–46)
Lymphs Abs: 0.8 10*3/uL (ref 0.7–4.0)
MONOS PCT: 5 % (ref 3–12)
Monocytes Absolute: 1.4 10*3/uL — ABNORMAL HIGH (ref 0.1–1.0)
NEUTROS PCT: 92 % — AB (ref 43–77)
Neutro Abs: 25.2 10*3/uL — ABNORMAL HIGH (ref 1.7–7.7)

## 2013-04-12 LAB — CBC
HCT: 28.9 % — ABNORMAL LOW (ref 36.0–46.0)
Hemoglobin: 9.5 g/dL — ABNORMAL LOW (ref 12.0–15.0)
MCH: 30.5 pg (ref 26.0–34.0)
MCHC: 32.9 g/dL (ref 30.0–36.0)
MCV: 92.9 fL (ref 78.0–100.0)
PLATELETS: 373 10*3/uL (ref 150–400)
RBC: 3.11 MIL/uL — AB (ref 3.87–5.11)
RDW: 18.8 % — AB (ref 11.5–15.5)
WBC: 27.4 10*3/uL — AB (ref 4.0–10.5)

## 2013-04-12 LAB — MAGNESIUM: MAGNESIUM: 2.3 mg/dL (ref 1.5–2.5)

## 2013-04-12 LAB — COMPREHENSIVE METABOLIC PANEL
ALT: 16 U/L (ref 0–35)
AST: 25 U/L (ref 0–37)
Albumin: 1.5 g/dL — ABNORMAL LOW (ref 3.5–5.2)
Alkaline Phosphatase: 125 U/L — ABNORMAL HIGH (ref 39–117)
BUN: 61 mg/dL — AB (ref 6–23)
CALCIUM: 7 mg/dL — AB (ref 8.4–10.5)
CO2: 22 mEq/L (ref 19–32)
Chloride: 100 mEq/L (ref 96–112)
Creatinine, Ser: 0.65 mg/dL (ref 0.50–1.10)
GFR calc non Af Amer: 84 mL/min — ABNORMAL LOW (ref >90)
GLUCOSE: 143 mg/dL — AB (ref 70–99)
POTASSIUM: 4.1 meq/L (ref 3.7–5.3)
SODIUM: 137 meq/L (ref 137–147)
TOTAL PROTEIN: 4.3 g/dL — AB (ref 6.0–8.3)
Total Bilirubin: 0.4 mg/dL (ref 0.3–1.2)

## 2013-04-12 LAB — TRIGLYCERIDES: TRIGLYCERIDES: 143 mg/dL (ref <150)

## 2013-04-12 LAB — PHOSPHORUS: PHOSPHORUS: 4.3 mg/dL (ref 2.3–4.6)

## 2013-04-12 LAB — PREALBUMIN: PREALBUMIN: 12.9 mg/dL — AB (ref 17.0–34.0)

## 2013-04-16 LAB — CBC
HCT: 27.9 % — ABNORMAL LOW (ref 36.0–46.0)
HEMOGLOBIN: 9.2 g/dL — AB (ref 12.0–15.0)
MCH: 30.8 pg (ref 26.0–34.0)
MCHC: 33 g/dL (ref 30.0–36.0)
MCV: 93.3 fL (ref 78.0–100.0)
Platelets: 322 10*3/uL (ref 150–400)
RBC: 2.99 MIL/uL — AB (ref 3.87–5.11)
RDW: 19.1 % — ABNORMAL HIGH (ref 11.5–15.5)
WBC: 17.3 10*3/uL — AB (ref 4.0–10.5)

## 2013-04-16 LAB — BASIC METABOLIC PANEL
BUN: 49 mg/dL — ABNORMAL HIGH (ref 6–23)
CALCIUM: 7.3 mg/dL — AB (ref 8.4–10.5)
CO2: 21 meq/L (ref 19–32)
Chloride: 103 mEq/L (ref 96–112)
Creatinine, Ser: 0.57 mg/dL (ref 0.50–1.10)
GFR calc Af Amer: >90 mL/min (ref >90)
GFR calc non Af Amer: 88 mL/min — ABNORMAL LOW (ref >90)
GLUCOSE: 90 mg/dL (ref 70–99)
POTASSIUM: 3.8 meq/L (ref 3.7–5.3)
SODIUM: 139 meq/L (ref 137–147)

## 2013-05-16 DEATH — deceased

## 2014-02-04 IMAGING — CR DG CHEST 1V PORT
1 series · 1 of 1 positions shown · non-contrast
Comparison: 03/05/2013.

CLINICAL DATA: Respiratory failure .

EXAM:
PORTABLE CHEST - 1 VIEW

[AP]
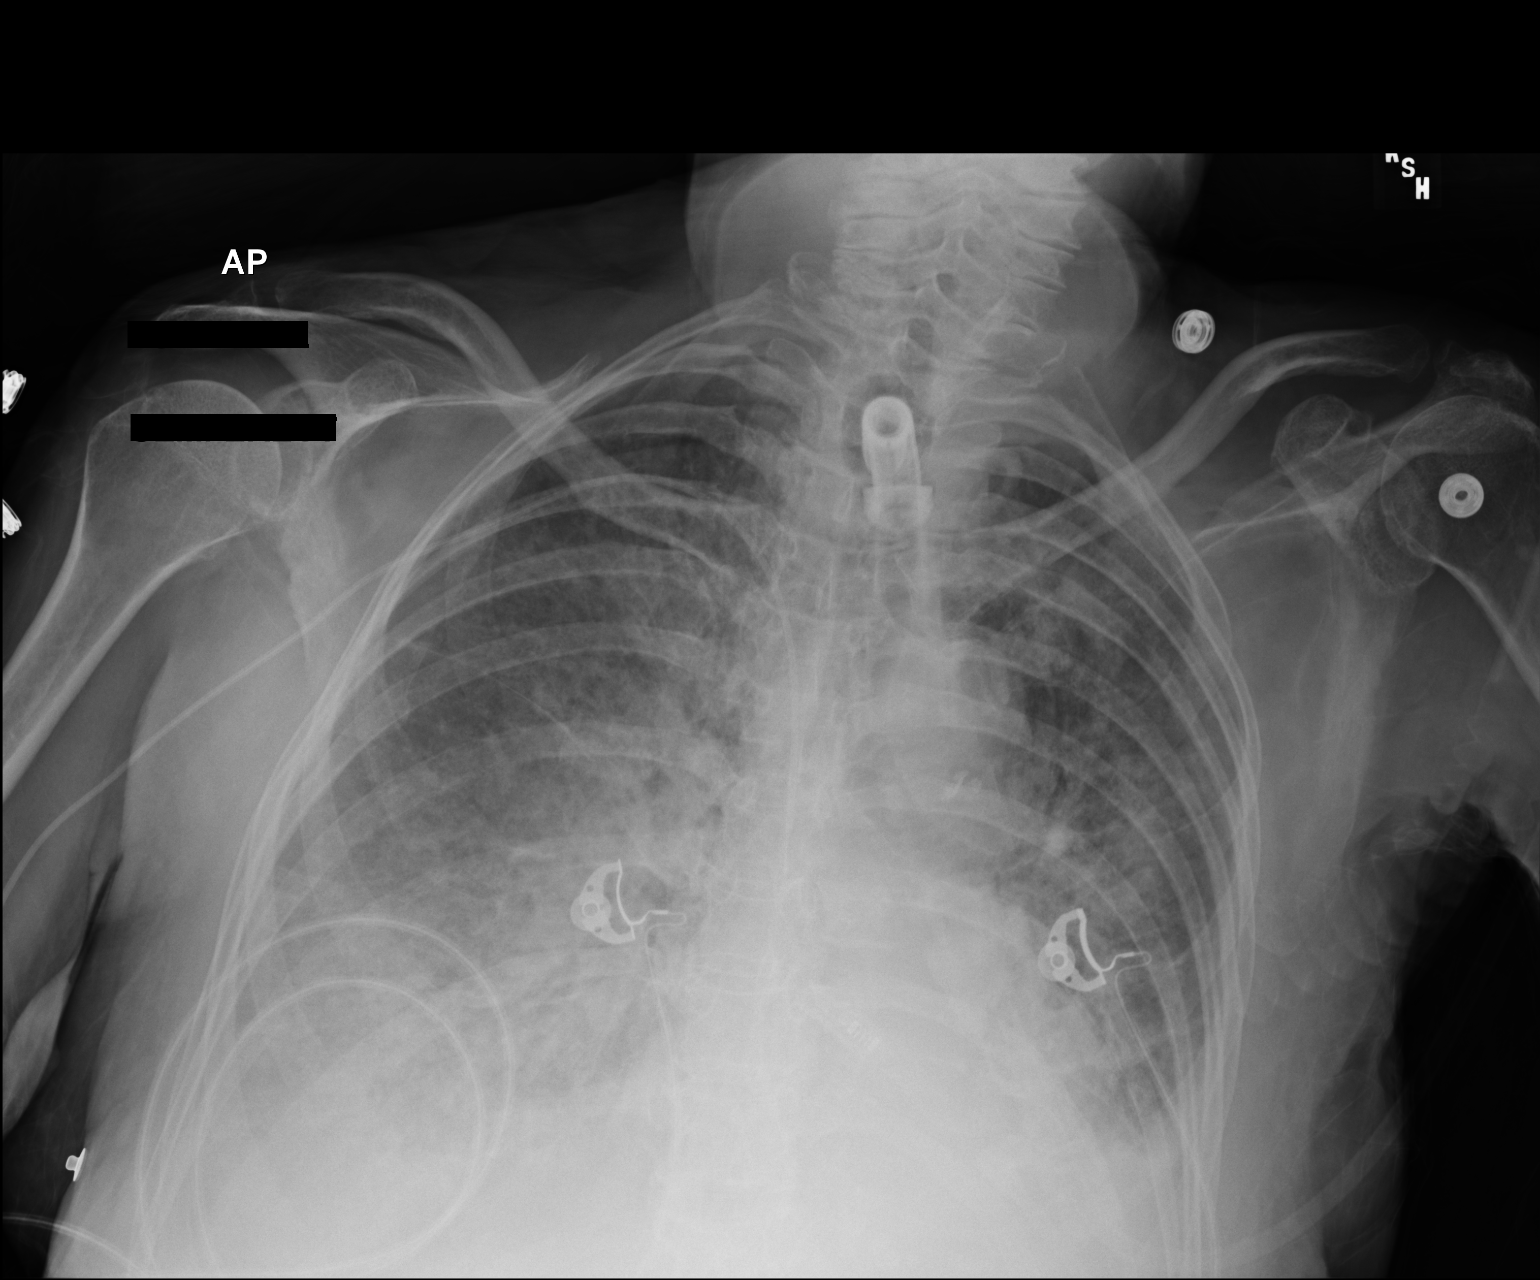

[1 of 1 positions shown; findings below may reference images not displayed]

FINDINGS: Tracheostomy tube in good anatomic position. PICC line in stable
position. No cardiomegaly. Persistent and progressed bilateral
pulmonary alveolar infiltrates are noted. Small pleural effusions
cannot be excluded . No pneumothorax. No acute osseous abnormality.
IMPRESSION: 1. Stable line and tube positions.
2. Progressive bilateral pulmonary alveolar infiltrates. Small
pleural effusions may be present bilaterally. These findings suggest
the presence of congestive heart failure with pulmonary edema.
Superimposed pneumonia cannot be excluded .

## 2014-02-10 IMAGING — CR DG CHEST 1V PORT
1 series · 1 of 1 positions shown · non-contrast
Comparison: 03/08/2013

CLINICAL DATA: Pneumonia.

EXAM:
PORTABLE CHEST - 1 VIEW

[AP]
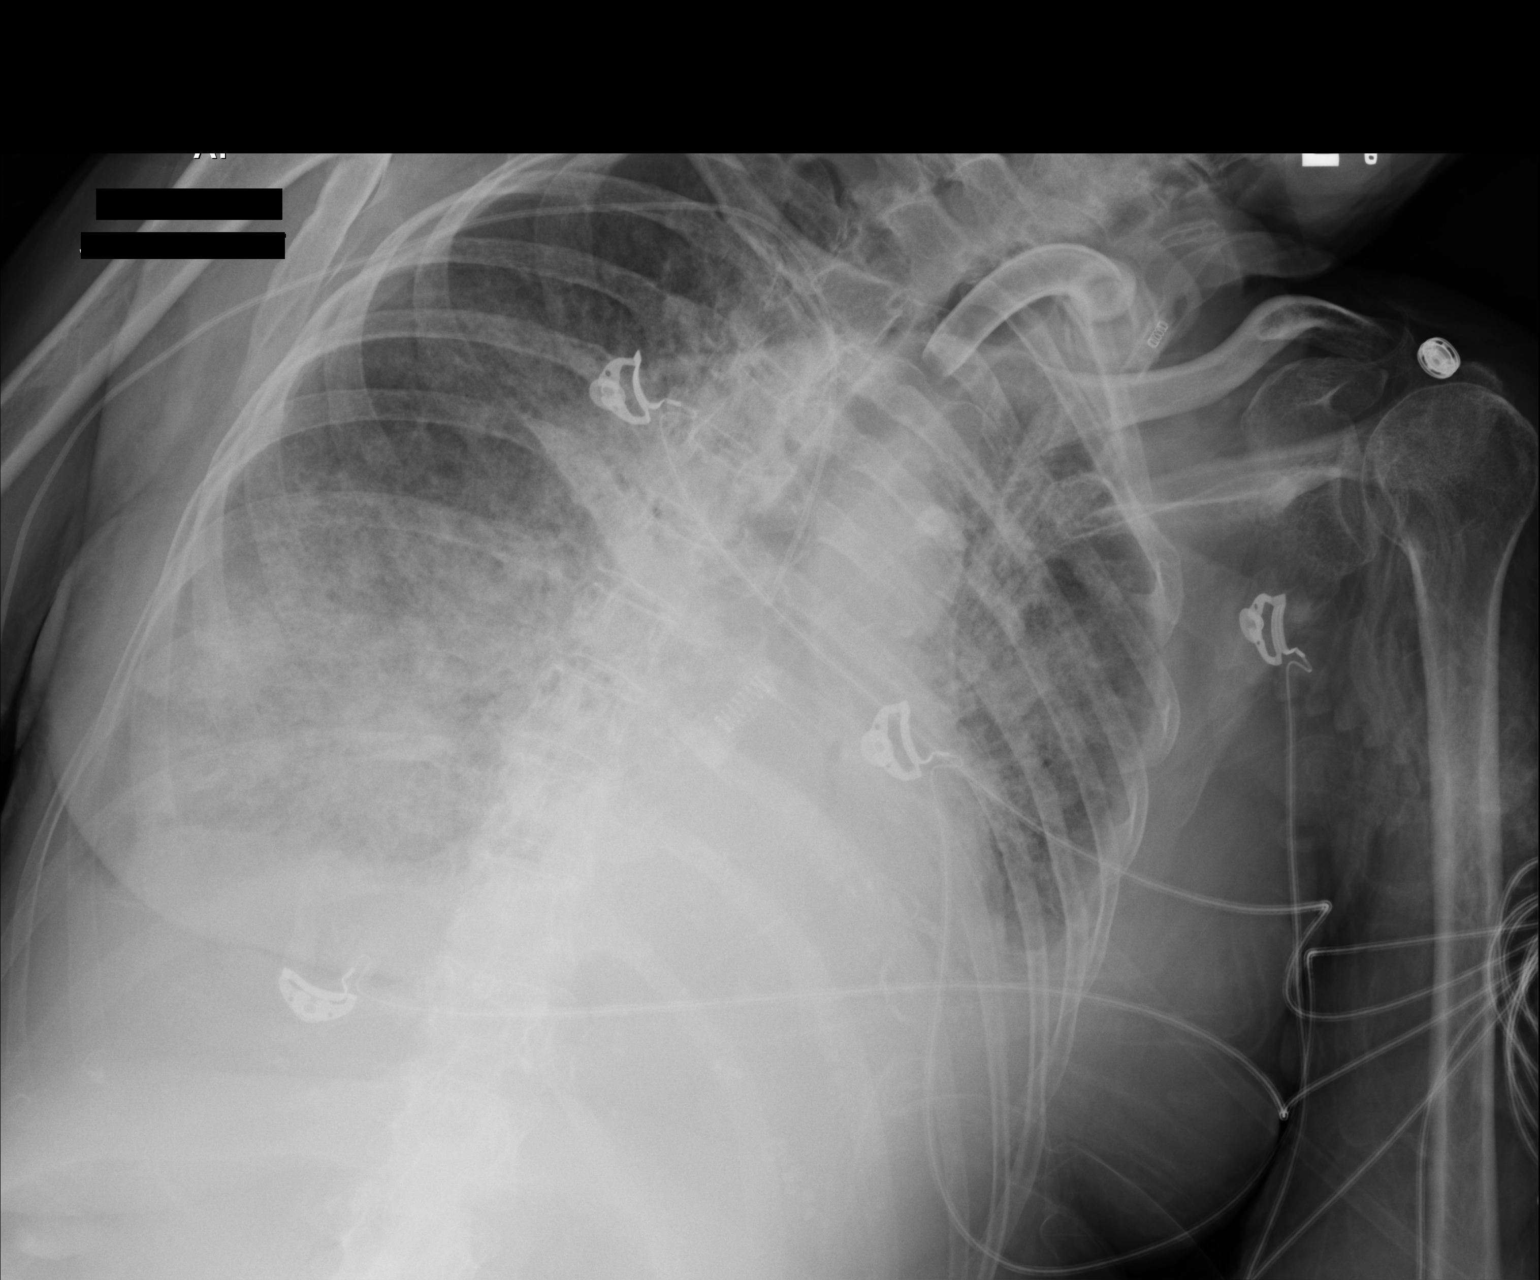

[1 of 1 positions shown; findings below may reference images not displayed]

FINDINGS: Tracheostomy and PICC line positioning are stable. Prominent
bilateral lower lung and perihilar infiltrates are again noted with
potentially slight worsening. No associated edema is identified.
There may be small bilateral pleural effusions.
IMPRESSION: Persistent prominent bilateral pneumonia with potential slight
worsening.

## 2014-02-25 IMAGING — CR DG CHEST 1V PORT
1 series · 1 of 1 positions shown · non-contrast
Comparison: 03/24/2013.

CLINICAL DATA: Respiratory failure.

EXAM:
PORTABLE CHEST - 1 VIEW

[AP]
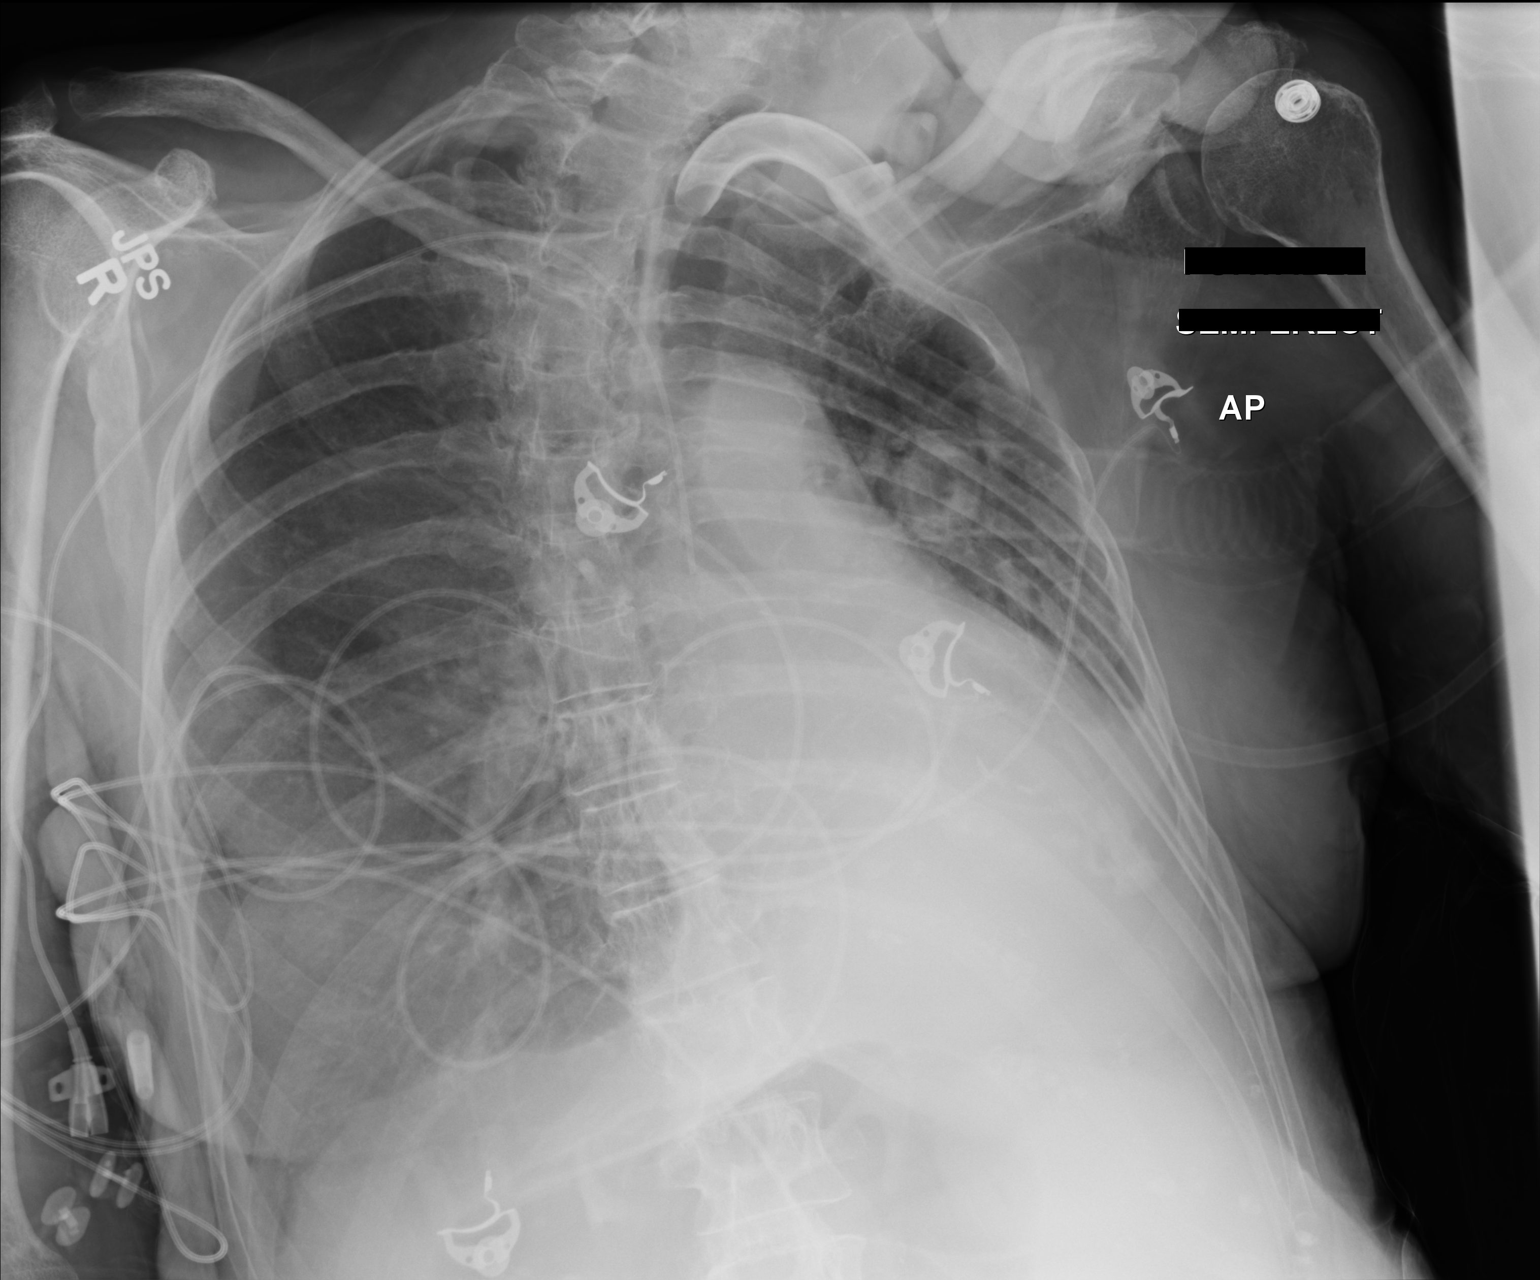

[1 of 1 positions shown; findings below may reference images not displayed]

FINDINGS: Patient is rotated. Tracheostomy is grossly midline. Right PICC tip
projects over the SVC. Heart size stable. Improved aeration in the
left lung with persistent bibasilar dependent airspace disease, left
greater than right. Bilateral effusions, as before.
IMPRESSION: 1. Bibasilar airspace disease, left greater than right, with marked
improvement in left upper lobe aeration.
2. Bilateral pleural effusions.

## 2014-02-28 IMAGING — CR DG ABD PORTABLE 1V
1 series · 1 of 1 positions shown · non-contrast
Comparison: 03/17/2013.

CLINICAL DATA: Ileus.

EXAM:
PORTABLE ABDOMEN - 1 VIEW

[AP]
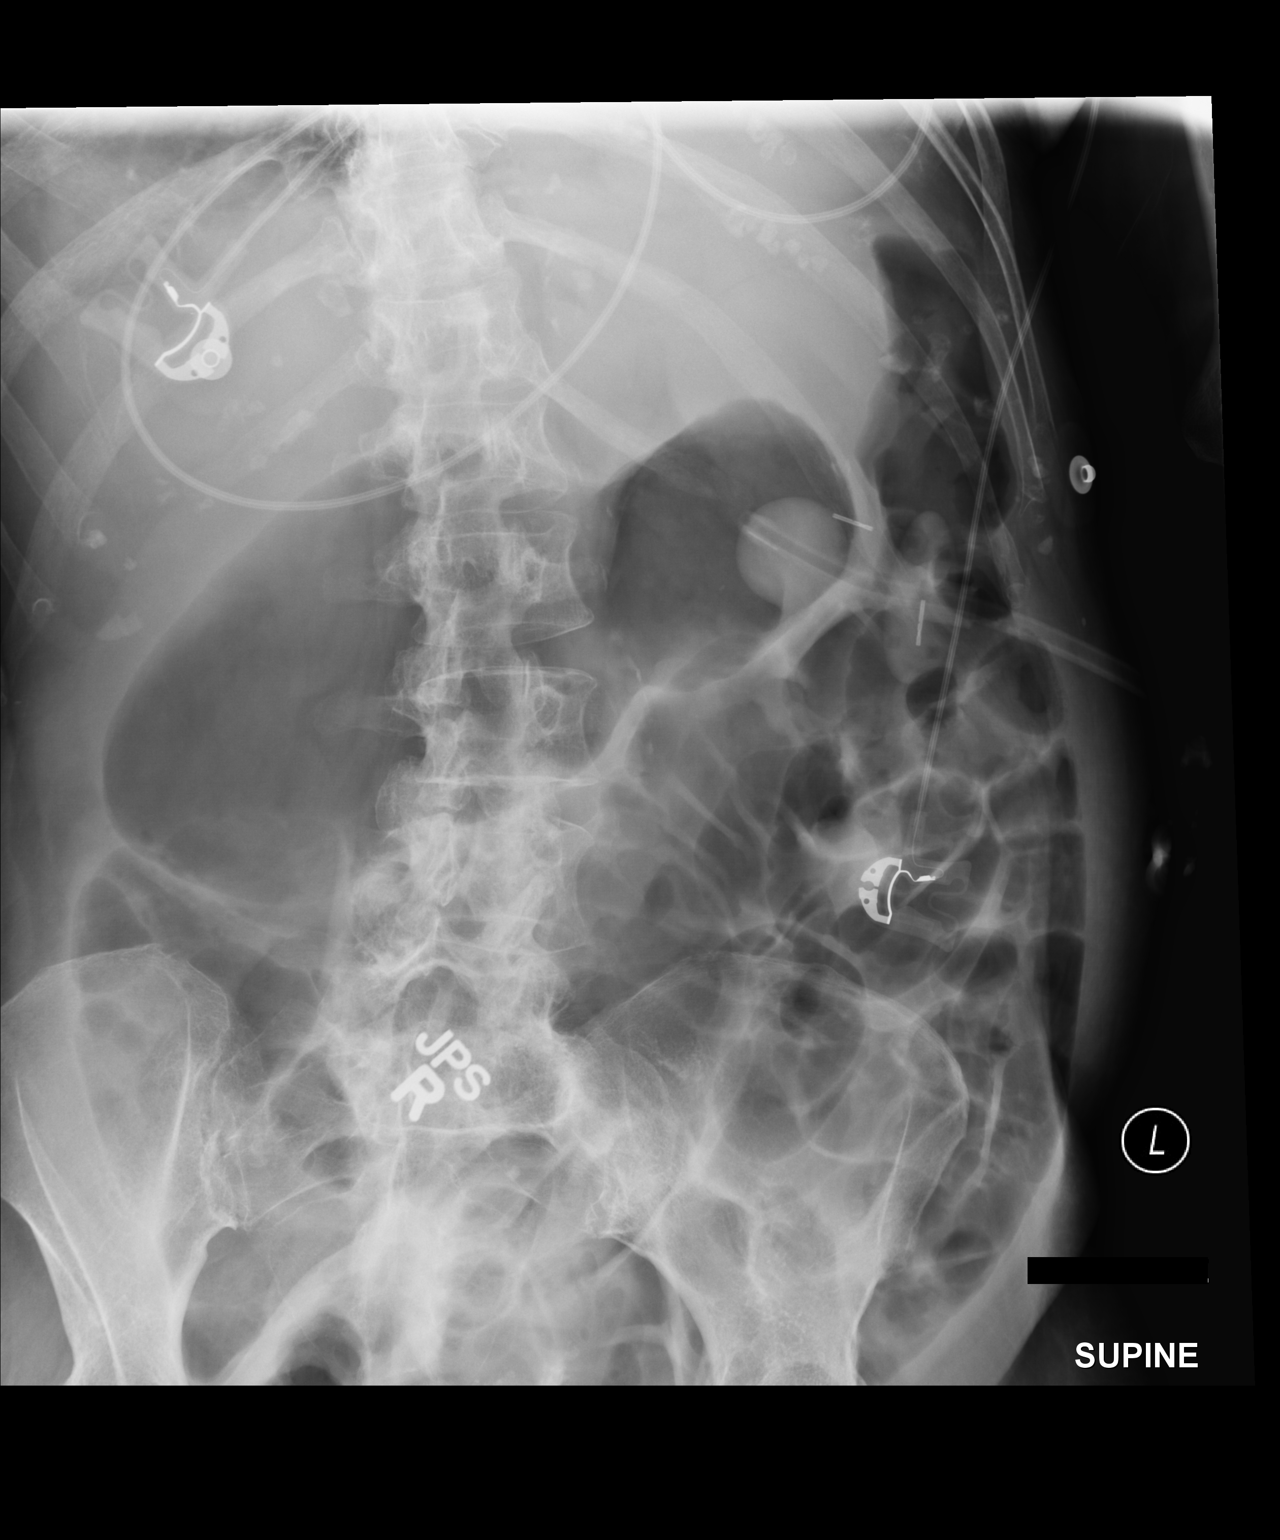

[1 of 1 positions shown; findings below may reference images not displayed]

FINDINGS: Gastrostomy tube noted in stable position. Mild to moderate gastric,
small bowel, and colonic distention . These findings are consistent
with adynamic ileus. No free air identified. Pelvic calcifications
consistent with fibroids. Degenerative changes lumbar spine and left
hip. Right hip replacement.
IMPRESSION: 1. Gastrostomy tube noted in good anatomic position.
2. Mild-to-moderate gastric, small bowel, and colonic distention
consistent with adynamic ileus.
